# Patient Record
Sex: Female | Born: 1975 | Race: White | Hispanic: No | Marital: Married | Smoking: Former smoker
Health system: Southern US, Community
[De-identification: ages and names within clinical notes are randomized; demographics above are authoritative.]

## PROBLEM LIST (undated history)

## (undated) ENCOUNTER — Emergency Department (HOSPITAL_BASED_OUTPATIENT_CLINIC_OR_DEPARTMENT_OTHER): Admission: EM | Payer: 59

## (undated) DIAGNOSIS — D259 Leiomyoma of uterus, unspecified: Secondary | ICD-10-CM

## (undated) DIAGNOSIS — K449 Diaphragmatic hernia without obstruction or gangrene: Secondary | ICD-10-CM

## (undated) DIAGNOSIS — D649 Anemia, unspecified: Secondary | ICD-10-CM

## (undated) HISTORY — DX: Diaphragmatic hernia without obstruction or gangrene: K44.9

## (undated) HISTORY — DX: Anemia, unspecified: D64.9

## (undated) HISTORY — PX: OTHER SURGICAL HISTORY: SHX169

## (undated) HISTORY — DX: Leiomyoma of uterus, unspecified: D25.9

---

## 2002-01-21 ENCOUNTER — Other Ambulatory Visit: Admission: RE | Admit: 2002-01-21 | Discharge: 2002-01-21 | Payer: Self-pay | Admitting: Gynecology

## 2002-08-27 ENCOUNTER — Inpatient Hospital Stay (HOSPITAL_COMMUNITY): Admission: AD | Admit: 2002-08-27 | Discharge: 2002-08-30 | Payer: Self-pay | Admitting: Gynecology

## 2002-09-04 ENCOUNTER — Encounter: Admission: RE | Admit: 2002-09-04 | Discharge: 2002-10-04 | Payer: Self-pay | Admitting: Gynecology

## 2002-10-05 ENCOUNTER — Encounter: Admission: RE | Admit: 2002-10-05 | Discharge: 2002-11-04 | Payer: Self-pay | Admitting: Gynecology

## 2002-10-10 ENCOUNTER — Other Ambulatory Visit: Admission: RE | Admit: 2002-10-10 | Discharge: 2002-10-10 | Payer: Self-pay | Admitting: Gynecology

## 2004-04-26 ENCOUNTER — Other Ambulatory Visit: Admission: RE | Admit: 2004-04-26 | Discharge: 2004-04-26 | Payer: Self-pay | Admitting: Gynecology

## 2004-11-24 ENCOUNTER — Inpatient Hospital Stay (HOSPITAL_COMMUNITY): Admission: AD | Admit: 2004-11-24 | Discharge: 2004-11-26 | Payer: Self-pay | Admitting: Gynecology

## 2004-11-27 ENCOUNTER — Encounter: Admission: RE | Admit: 2004-11-27 | Discharge: 2004-12-26 | Payer: Self-pay | Admitting: Gynecology

## 2004-12-27 ENCOUNTER — Encounter: Admission: RE | Admit: 2004-12-27 | Discharge: 2005-01-26 | Payer: Self-pay | Admitting: Gynecology

## 2005-01-11 ENCOUNTER — Other Ambulatory Visit: Admission: RE | Admit: 2005-01-11 | Discharge: 2005-01-11 | Payer: Self-pay | Admitting: Gynecology

## 2005-01-27 ENCOUNTER — Encounter: Admission: RE | Admit: 2005-01-27 | Discharge: 2005-02-26 | Payer: Self-pay | Admitting: Gynecology

## 2005-02-27 ENCOUNTER — Encounter: Admission: RE | Admit: 2005-02-27 | Discharge: 2005-03-26 | Payer: Self-pay | Admitting: Gynecology

## 2005-03-27 ENCOUNTER — Encounter: Admission: RE | Admit: 2005-03-27 | Discharge: 2005-04-05 | Payer: Self-pay | Admitting: Gynecology

## 2007-03-12 ENCOUNTER — Encounter: Admission: RE | Admit: 2007-03-12 | Discharge: 2007-03-12 | Payer: Self-pay | Admitting: Surgery

## 2007-08-08 ENCOUNTER — Other Ambulatory Visit: Admission: RE | Admit: 2007-08-08 | Discharge: 2007-08-08 | Payer: Self-pay | Admitting: Family Medicine

## 2008-04-13 ENCOUNTER — Emergency Department (HOSPITAL_COMMUNITY): Admission: EM | Admit: 2008-04-13 | Discharge: 2008-04-13 | Payer: Self-pay | Admitting: Emergency Medicine

## 2008-08-10 ENCOUNTER — Other Ambulatory Visit: Admission: RE | Admit: 2008-08-10 | Discharge: 2008-08-10 | Payer: Self-pay | Admitting: Family Medicine

## 2009-08-11 ENCOUNTER — Other Ambulatory Visit: Admission: RE | Admit: 2009-08-11 | Discharge: 2009-08-11 | Payer: Self-pay | Admitting: *Deleted

## 2010-01-30 ENCOUNTER — Encounter: Payer: Self-pay | Admitting: Surgery

## 2010-05-27 NOTE — Discharge Summary (Signed)
   NAME:  Janice Lawrence, Janice Lawrence                             ACCOUNT NO.:  [id number]   MEDICAL RECORD NO.:  [id number]                   PATIENT TYPE:   LOCATION:                                       FACILITY:   PHYSICIAN:  Timothy P. Fontaine, M.D.           DATE OF BIRTH:   DATE OF ADMISSION:  08/27/2002  DATE OF DISCHARGE:  08/30/2002                                 DISCHARGE SUMMARY   DISCHARGE DIAGNOSES:  Intrauterine pregnancy at term with pregnancy-induced  hypertension.   PROCEDURE:  Induced labor with vaginal delivery with midline episiotomy with  a perineal laceration of a viable female.   HISTORY OF PRESENT ILLNESS:  A 35 year old gravida 1, para 0 with an EDC of  September 07, 2002. Currently at 38-5/7 weeks. She was admitted to [facility] secondary to history of labile PIH. She had been followed with  antepartum testing consisting of NST, AFIs, and blood pressure monitoring.  She has had PIH panel, which was normal. Blood pressure at the office was  136/86 with reflexes of +1 to 2 with trace pitting edema. No clonus. She had  no upper quadrant pain, visual changes or headaches. Potentially, she had  underlying chronic hypertension.   LABORATORY DATA:  Blood type was A+, antibody negative. Serology  nonreactive. Rubella titer positive. Hepatitis non-reactive. HIV non-  reactive and group B strep negative.   HOSPITAL COURSE:  She was admitted on August 27, 2002, for an induction due  to the [facility]. She was given Cervidil, Pitocin and AROM. She had a spontaneous  delivery with midline episiotomy for perineal laceration of a viable female  with Apgars of 9 and 9 with a birth weight of 7 pounds, 5 ounces.  Postpartum, she remained afebrile, voiding, and was discharged in  satisfactory condition on her second postpartum day. Blood pressures  remained in the 120s over 80s postpartum.   DISCHARGE LABORATORY DATA:  White count 8, hemoglobin 9.4, hematocrit 26.5  and platelets  91,000.   DISPOSITION:  Discharged to home. Instructed to followup in 6 weeks or as  needed. Continue prenatal vitamins and iron. Prescription for Motrin as  needed for pain. __________ discharge booklet.       Davonna Belling. Young, N.P.                      Timothy P. Fontaine, M.D.    [facility]  D:  09/26/2002  T:  09/27/2002  Job:  [id number]

## 2010-05-27 NOTE — H&P (Signed)
NAME:  Bettcher, Lataja                ACCOUNT NO.:  [id number]   MEDICAL RECORD NO.:  [id number]           PATIENT TYPE:   LOCATION:                                [facility]ACILITY:  [facility]   PHYSICIAN:  Juan H. Lily Peer, M.D.DATE OF BIRTH:  [birth year]   DATE OF ADMISSION:  11/24/2004  DATE OF DISCHARGE:  11/26/2004                                HISTORY & PHYSICAL   CHIEF COMPLAINT:  1.  Postdate pregnancy.  2.  Favorable cervix.   HISTORY:  The patient is a 35 year old, gravida 2, para 1, last menstrual  period February 11, 2004, estimated date of confinement November 17, 2004.  Currently 40-1/[redacted] weeks gestation, will be admitted to [facility] on  Thursday, November 16, at 5:30 a.m. for initiation of Pitocin, followed by  rupture of membranes for delivery.  The patient was seen in the office on  November 14 for a routine prenatal visit.  Her blood pressure initially was  found to be elevated at 140/90; on repeat was 118/80, and her cervix is  found to be 2-3 cm, 80% effaced, -3 station.  The patient's prenatal course  is found that she has had labile hypertension which has resolved with  positioning and rest.  Otherwise, the remainder of her prenatal course has  been essentially unremarkable.   PAST MEDICAL HISTORY:  1.  The patient with 1 normal spontaneous vaginal delivery in 2004.  2.  She is currently on prenatal vitamins and iron.   REVIEW OF SYSTEMS:  See [place] form.   PHYSICAL EXAMINATION:  HEENT:  Unremarkable.  NECK:  Supple.  Trachea midline.  No carotid bruits.  No thyromegaly.  LUNGS:  Clear to auscultation without rhonchi or wheezes.  HEART:  Regular rate and rhythm without any murmurs or gallops.  BREAST EXAM:  Not done.  ABDOMEN:  Gravid uterus, vertex presentation by [employer].  Fundal  height 39 cm.  PELVIC EXAM:  Cervix was 2-3 cm dilated, 80% effaced, -3 station.  EXTREMITIES:  DTR 1+, negative clonus, trace edema.   ASSESSMENT:  A 35 year old, gravida  1, para 1, at 40-1/2 weeks' estimated  gestational age with advanced cervical dilatation, will be admitted to  [facility] on Thursday, November 18 for a Pitocin induction  followed by rupture of membranes and with goals of successfully delivering  vaginally.  The patient's GBS culture was negative.  Prenatal labs:  See  [place] form.   PLAN:  As per assessment above.      Juan H. Lily Peer, M.D.  Electronically Signed     JHF/MEDQ  D:  11/22/2004  T:  11/22/2004  Job:  [id number]

## 2010-05-27 NOTE — H&P (Signed)
NAME:  Janice Lawrence, Janice Lawrence                            ACCOUNT NO.:  [id number]   MEDICAL RECORD NO.:  [id number]                   PATIENT TYPE:  MAT   LOCATION:                                       FACILITY:  [facility]   PHYSICIAN:  Juan H. Lily Peer, M.D.             DATE OF BIRTH:   DATE OF ADMISSION:  08/27/2002  DATE OF DISCHARGE:                                HISTORY & PHYSICAL   CHIEF COMPLAINT:  Labile pregnancy-induced hypertension, at term.   HISTORY:  The patient is a 35 year old gravida 1, para 0 with an estimated  date of confinement of September 07, 2002; currently at 38-1/2 weeks estimated  gestational age.  She is being admitted to [facility] secondary to the  fact that the patient has had history of labile PIH.  She has been followed  with antepartum testing, consisting of NST's, AFI's and also blood pressure  monitoring as well.  She has also had PIH panel, which has been normal; the  last one done recently.  The last time she was seen in the office was August 25, 2002; her blood pressure was 136/86, DTR was 1-2+ with trace pitting  edema, no clonus.  She had denied any right upper quadrant pain, any blurry  vision or headaches.  She could potentially have underlying chronic  hypertension.  Review of her prenatal records have indicated that her  baseline blood pressure has been 126/74, but at times it had gotten up as  high as 140/90, 130/82 (which would return back to normal on rest).  She  also had been evaluated by [facility] perinatal service, secondary to the fact  that at time of screening ultrasound there was a questionable bright  endocardium in the right ventricle.  The perinatologist did a Level 3 scan;  did not demonstrate any abnormality, normal four-chamber view, and on follow-  up scan at 38 weeks the four-chamber heart appeared to be normal with no  abnormality.  There did come a time that during the scan that there appears  to be some right renal pyelectasis,  which will need to be followed up with  an ultrasound after the newborn is delivered.  The rest of the pregnancy has  otherwise been uneventful.   PAST MEDICAL HISTORY:  The patient has had LASIK surgery of her eyes.  She  has had a wisdom tooth removed.   ALLERGIES:  The patient denies any allergies.   REVIEW OF SYSTEMS:  See [place] form.   PHYSICAL EXAMINATION:  VITAL SIGNS:  Blood pressure 136/86, weight 198-1/2  pounds.  Urine was trace protein, negative glucose.  HEENT:  Unremarkable.  NECK:  Supple.  Trachea midline.  No carotid bruits, no thyromegaly.  LUNGS:  Clear to auscultation without rhonchi or wheezes.  HEART:  Regular rate and rhythm; no murmurs or gallops.  GU:  Vaginal examination not done.  ABDOMEN:  Gravid uterus, vertex presentation by [employer].  Positive  fetal heart tones.  PELVIC:  Cervix 1 cm, 50% effaced, -3 station.  EXTREMITIES:  DTR 1-2+.  Negative clonus.  Trace edema.   PRENATAL LABS:  Blood type A positive.  Negative antibody screen.  VDRL was  nonreactive.  Rubella immune.  Hepatitis B surface antigen and HIV were  negative.  Internal serum alpha fetoprotein was normal.  Diabetes screen  normal.  GBS culture negative.   ASSESSMENT:  A 35 year old gravida 1, para 0 at 38-1/2 weeks estimated  gestational age.  Scheduled to be admitted this evening for cervical  ripening agent with Cervidil, with initiation of Pitocin in the morning.  Will obtain CBC and PIH panel upon admission.  Will continue to monitor her  blood pressures closely, in the event that she may need to be placed on  magnesium sulfate.  The patient also fully aware that due to her cervix not  being as favorable, that this could potentially be a serial induction.  All  questions are answered and will follow accordingly.   PLAN:  As per assessment above.                                                Juan H. Lily Peer, M.D.    JHF/MEDQ  D:  08/27/2002  T:  08/27/2002   Job:  [id number]

## 2010-07-21 ENCOUNTER — Other Ambulatory Visit: Payer: Self-pay | Admitting: Family Medicine

## 2010-07-21 DIAGNOSIS — R1032 Left lower quadrant pain: Secondary | ICD-10-CM

## 2010-07-22 ENCOUNTER — Other Ambulatory Visit: Payer: Self-pay | Admitting: Pediatrics

## 2010-07-25 ENCOUNTER — Ambulatory Visit
Admission: RE | Admit: 2010-07-25 | Discharge: 2010-07-25 | Disposition: A | Discharge status: Home / Self Care | Payer: BC Managed Care – PPO | Source: Ambulatory Visit | Attending: Family Medicine | Admitting: Family Medicine

## 2010-07-25 DIAGNOSIS — R1032 Left lower quadrant pain: Secondary | ICD-10-CM

## 2011-04-25 ENCOUNTER — Other Ambulatory Visit (HOSPITAL_COMMUNITY)
Admission: RE | Admit: 2011-04-25 | Discharge: 2011-04-25 | Disposition: A | Discharge status: Home / Self Care | Payer: 59 | Source: Ambulatory Visit | Attending: Family Medicine | Admitting: Family Medicine

## 2011-04-25 ENCOUNTER — Other Ambulatory Visit: Payer: Self-pay | Admitting: Family Medicine

## 2011-04-25 ENCOUNTER — Other Ambulatory Visit: Payer: Self-pay

## 2011-04-25 DIAGNOSIS — Z803 Family history of malignant neoplasm of breast: Secondary | ICD-10-CM

## 2011-04-25 DIAGNOSIS — N63 Unspecified lump in unspecified breast: Secondary | ICD-10-CM

## 2011-04-25 DIAGNOSIS — Z01419 Encounter for gynecological examination (general) (routine) without abnormal findings: Secondary | ICD-10-CM | POA: Insufficient documentation

## 2011-04-25 DIAGNOSIS — Q8909 Congenital malformations of spleen: Secondary | ICD-10-CM

## 2011-04-28 ENCOUNTER — Ambulatory Visit
Admission: RE | Admit: 2011-04-28 | Discharge: 2011-04-28 | Disposition: A | Discharge status: Home / Self Care | Payer: 59 | Source: Ambulatory Visit | Attending: Family Medicine | Admitting: Family Medicine

## 2011-04-28 DIAGNOSIS — Z803 Family history of malignant neoplasm of breast: Secondary | ICD-10-CM

## 2011-04-28 DIAGNOSIS — N63 Unspecified lump in unspecified breast: Secondary | ICD-10-CM

## 2013-07-15 ENCOUNTER — Other Ambulatory Visit: Payer: Self-pay | Admitting: Family Medicine

## 2013-07-15 ENCOUNTER — Other Ambulatory Visit (HOSPITAL_COMMUNITY)
Admission: RE | Admit: 2013-07-15 | Discharge: 2013-07-15 | Disposition: A | Discharge status: Home / Self Care | Payer: 59 | Source: Ambulatory Visit | Attending: Family Medicine | Admitting: Family Medicine

## 2013-07-15 DIAGNOSIS — Z0000 Encounter for general adult medical examination without abnormal findings: Secondary | ICD-10-CM | POA: Insufficient documentation

## 2013-07-15 DIAGNOSIS — N63 Unspecified lump in unspecified breast: Secondary | ICD-10-CM

## 2013-07-17 LAB — CYTOLOGY - PAP

## 2013-07-23 ENCOUNTER — Ambulatory Visit
Admission: RE | Admit: 2013-07-23 | Discharge: 2013-07-23 | Disposition: A | Discharge status: Home / Self Care | Payer: 59 | Source: Ambulatory Visit | Attending: Family Medicine | Admitting: Family Medicine

## 2013-07-23 DIAGNOSIS — N63 Unspecified lump in unspecified breast: Secondary | ICD-10-CM

## 2015-10-19 ENCOUNTER — Ambulatory Visit: Payer: Self-pay | Admitting: General Surgery

## 2016-03-17 DIAGNOSIS — Z79899 Other long term (current) drug therapy: Secondary | ICD-10-CM | POA: Diagnosis not present

## 2017-06-15 DIAGNOSIS — J029 Acute pharyngitis, unspecified: Secondary | ICD-10-CM | POA: Diagnosis not present

## 2017-09-19 DIAGNOSIS — Z7289 Other problems related to lifestyle: Secondary | ICD-10-CM | POA: Diagnosis not present

## 2018-01-08 DIAGNOSIS — Z720 Tobacco use: Secondary | ICD-10-CM | POA: Diagnosis not present

## 2019-04-03 ENCOUNTER — Ambulatory Visit: Payer: 59 | Attending: Internal Medicine

## 2019-04-03 DIAGNOSIS — Z23 Encounter for immunization: Secondary | ICD-10-CM

## 2019-04-03 NOTE — Progress Notes (Signed)
   Covid-19 Vaccination Clinic  Name:  Janice Lawrence    MRN: [id number] DOB: [birth year]  04/03/2019  [name] was observed post Covid-19 immunization for 15 minutes without incident. She was provided with Vaccine Information Sheet and instruction to access the V-Safe system.   [name] was instructed to call 911 with any severe reactions post vaccine: Marland Kitchen Difficulty breathing  . Swelling of face and throat  . A fast heartbeat  . A bad rash all over body  . Dizziness and weakness   Immunizations Administered    Name Date Dose VIS Date Route   Pfizer COVID-19 Vaccine 04/03/2019  4:27 PM 0.3 mL 12/20/2018 Intramuscular   Manufacturer: [place]   Lot: [id number]   [place]: [id number]

## 2019-04-28 ENCOUNTER — Ambulatory Visit: Payer: 59 | Attending: Internal Medicine

## 2019-04-28 DIAGNOSIS — Z23 Encounter for immunization: Secondary | ICD-10-CM

## 2019-04-28 NOTE — Progress Notes (Signed)
   Covid-19 Vaccination Clinic  Name:  Janice Lawrence    MRN: [id number] DOB: [birth year]  04/28/2019  Janice Lawrence was observed post Covid-19 immunization for 15 minutes without incident. She was provided with Vaccine Information Sheet and instruction to access the V-Safe system.   Janice Lawrence was instructed to call 911 with any severe reactions post vaccine: Marland Kitchen Difficulty breathing  . Swelling of face and throat  . A fast heartbeat  . A bad rash all over body  . Dizziness and weakness   Immunizations Administered    Name Date Dose VIS Date Route   Pfizer COVID-19 Vaccine 04/28/2019  1:56 PM 0.3 mL 03/05/2018 Intramuscular   Manufacturer: [place]   Lot: [id number]   [place]: [id number]

## 2021-05-25 ENCOUNTER — Other Ambulatory Visit: Payer: Self-pay | Admitting: Obstetrics and Gynecology

## 2021-05-25 DIAGNOSIS — R928 Other abnormal and inconclusive findings on diagnostic imaging of breast: Secondary | ICD-10-CM

## 2021-05-26 ENCOUNTER — Encounter (HOSPITAL_COMMUNITY): Payer: Self-pay | Admitting: Emergency Medicine

## 2021-05-26 ENCOUNTER — Emergency Department (HOSPITAL_COMMUNITY)
Admission: EM | Admit: 2021-05-26 | Discharge: 2021-05-26 | Disposition: A | Discharge status: Home / Self Care | Payer: 59 | Attending: Emergency Medicine | Admitting: Emergency Medicine

## 2021-05-26 ENCOUNTER — Encounter: Payer: Self-pay | Admitting: Physician Assistant

## 2021-05-26 ENCOUNTER — Other Ambulatory Visit: Payer: Self-pay

## 2021-05-26 ENCOUNTER — Emergency Department (HOSPITAL_COMMUNITY): Payer: 59

## 2021-05-26 DIAGNOSIS — D500 Iron deficiency anemia secondary to blood loss (chronic): Secondary | ICD-10-CM | POA: Insufficient documentation

## 2021-05-26 DIAGNOSIS — D259 Leiomyoma of uterus, unspecified: Secondary | ICD-10-CM | POA: Diagnosis not present

## 2021-05-26 DIAGNOSIS — D649 Anemia, unspecified: Secondary | ICD-10-CM | POA: Diagnosis present

## 2021-05-26 LAB — CBC
HCT: 27.7 % — ABNORMAL LOW (ref 36.0–46.0)
Hemoglobin: 7.2 g/dL — ABNORMAL LOW (ref 12.0–15.0)
MCH: 16 pg — ABNORMAL LOW (ref 26.0–34.0)
MCHC: 26 g/dL — ABNORMAL LOW (ref 30.0–36.0)
MCV: 61.4 fL — ABNORMAL LOW (ref 80.0–100.0)
Platelets: 247 10*3/uL (ref 150–400)
RBC: 4.51 MIL/uL (ref 3.87–5.11)
RDW: 20.1 % — ABNORMAL HIGH (ref 11.5–15.5)
WBC: 7.2 10*3/uL (ref 4.0–10.5)
nRBC: 0 % (ref 0.0–0.2)

## 2021-05-26 LAB — COMPREHENSIVE METABOLIC PANEL
ALT: 13 U/L (ref 0–44)
AST: 14 U/L — ABNORMAL LOW (ref 15–41)
Albumin: 4.2 g/dL (ref 3.5–5.0)
Alkaline Phosphatase: 35 U/L — ABNORMAL LOW (ref 38–126)
Anion gap: 8 (ref 5–15)
BUN: 12 mg/dL (ref 6–20)
CO2: 22 mmol/L (ref 22–32)
Calcium: 9.5 mg/dL (ref 8.9–10.3)
Chloride: 106 mmol/L (ref 98–111)
Creatinine, Ser: 0.85 mg/dL (ref 0.44–1.00)
GFR, Estimated: >60 mL/min (ref >60)
Glucose, Bld: 112 mg/dL — ABNORMAL HIGH (ref 70–99)
Potassium: 4.2 mmol/L (ref 3.5–5.1)
Sodium: 136 mmol/L (ref 135–145)
Total Bilirubin: 0.4 mg/dL (ref 0.3–1.2)
Total Protein: 7.4 g/dL (ref 6.5–8.1)

## 2021-05-26 LAB — TYPE AND SCREEN
ABO/RH(D): A POS
Antibody Screen: NEGATIVE

## 2021-05-26 LAB — POC URINE PREG, ED: Preg Test, Ur: NEGATIVE

## 2021-05-26 LAB — POC OCCULT BLOOD, ED: Fecal Occult Bld: NEGATIVE

## 2021-05-26 MED ORDER — IOHEXOL 300 MG/ML  SOLN
100.0000 mL | Freq: Once | INTRAMUSCULAR | Status: AC | PRN
  Administered 2021-05-26: 100 mL via INTRAVENOUS

## 2021-05-26 MED ORDER — SODIUM CHLORIDE 0.9 % IV BOLUS: 1000.0000 mL | Freq: Once | INTRAVENOUS | Status: DC

## 2021-05-26 MED ORDER — FERROUS SULFATE 325 (65 FE) MG PO TABS: 325.0000 mg | ORAL_TABLET | Freq: Every day | ORAL | 0 refills | Status: AC

## 2021-05-26 NOTE — ED Triage Notes (Addendum)
Patient referred to ED for anemia, reported Hgb of 7. Patient denies known sources of bleeding, reports fatigue over the last few months. Patient is alert, oriented, ambulatory, and in no apparent distress at this time.

## 2021-05-26 NOTE — Discharge Instructions (Signed)
Follow-up with your GYN doctor as planned

## 2021-05-26 NOTE — ED Provider Notes (Signed)
[facility] EMERGENCY DEPARTMENT Provider Note   CSN: [id number] Arrival date & time: 05/26/21  1620     History  Chief Complaint  Patient presents with   Anemia    Janice Lawrence is a 46 y.o. female.  Patient complains of fatigue.  She saw her family doctor and her blood count was very low.  Patient complains of weakness.  She has no other medical problems  The history is provided by the patient and a friend. No language interpreter was used.  Anemia This is a new problem. The current episode started 2 days ago. The problem occurs constantly. The problem has not changed since onset.Pertinent negatives include no chest pain, no abdominal pain and no headaches. Nothing aggravates the symptoms. Nothing relieves the symptoms. She has tried nothing for the symptoms. The treatment provided no relief.      Home Medications Prior to Admission medications   Medication Sig Start Date End Date Taking? Authorizing Provider  ferrous sulfate 325 (65 FE) MG tablet Take 1 tablet (325 mg total) by mouth daily. 05/26/21  Yes Milton Ferguson, MD      Allergies    Patient has no known allergies.    Review of Systems   Review of Systems  Constitutional:  Positive for fatigue. Negative for appetite change.  HENT:  Negative for congestion, ear discharge and sinus pressure.   Eyes:  Negative for discharge.  Respiratory:  Negative for cough.   Cardiovascular:  Negative for chest pain.  Gastrointestinal:  Negative for abdominal pain and diarrhea.  Genitourinary:  Negative for frequency and hematuria.  Musculoskeletal:  Negative for back pain.  Skin:  Negative for rash.  Neurological:  Negative for seizures and headaches.  Psychiatric/Behavioral:  Negative for hallucinations.    Physical Exam Updated Vital Signs BP 120/82   Pulse 80   Temp 98.4 F (36.9 C) (Oral)   Resp 19   SpO2 100%  Physical Exam Vitals and nursing note reviewed.  Constitutional:      Appearance: She  is well-developed.  HENT:     Head: Normocephalic.     Nose: Nose normal.  Eyes:     General: No scleral icterus.    Conjunctiva/sclera: Conjunctivae normal.  Neck:     Thyroid: No thyromegaly.  Cardiovascular:     Rate and Rhythm: Normal rate and regular rhythm.     Heart sounds: No murmur heard.   No friction rub. No gallop.  Pulmonary:     Breath sounds: No stridor. No wheezing or rales.  Chest:     Chest wall: No tenderness.  Abdominal:     General: There is no distension.     Tenderness: There is no abdominal tenderness. There is no rebound.  Musculoskeletal:        General: Normal range of motion.     Cervical back: Neck supple.  Lymphadenopathy:     Cervical: No cervical adenopathy.  Skin:    Findings: No erythema or rash.  Neurological:     Mental Status: She is alert and oriented to person, place, and time.     Motor: No abnormal muscle tone.     Coordination: Coordination normal.  Psychiatric:        Behavior: Behavior normal.    ED Results / Procedures / Treatments   Labs (all labs ordered are listed, but only abnormal results are displayed) Labs Reviewed  COMPREHENSIVE METABOLIC PANEL - Abnormal; Notable for the following components:  Result Value   Glucose, Bld 112 (*)    AST 14 (*)    Alkaline Phosphatase 35 (*)    All other components within normal limits  CBC - Abnormal; Notable for the following components:   Hemoglobin 7.2 (*)    HCT 27.7 (*)    MCV 61.4 (*)    MCH 16.0 (*)    MCHC 26.0 (*)    RDW 20.1 (*)    All other components within normal limits  I-STAT BETA HCG BLOOD, ED ([facility], [facility], [facility] ONLY)  POC OCCULT BLOOD, ED  POC URINE PREG, ED  TYPE AND SCREEN  ABO/RH    EKG None  Radiology CT ABDOMEN PELVIS W CONTRAST  Result Date: 05/26/2021 CLINICAL DATA:  Fatigue and shortness of breath.  Anemia. EXAM: CT ABDOMEN AND PELVIS WITH CONTRAST TECHNIQUE: Multidetector CT imaging of the abdomen and pelvis was performed using the standard  protocol following bolus administration of intravenous contrast. RADIATION DOSE REDUCTION: This exam was performed according to the departmental dose-optimization program which includes automated exposure control, adjustment of the mA and/or kV according to patient size and/or use of iterative reconstruction technique. CONTRAST:  122m OMNIPAQUE IOHEXOL 300 MG/ML  SOLN COMPARISON:  None Available. FINDINGS: Lower chest: Small type 1 hiatal hernia. Hepatobiliary: 4 mm hypodense lesion in the right hepatic lobe on image 25 series 3, technically too small to characterize although statistically highly likely to be benign cyst. Mildly contracted gallbladder. No biliary dilatation. Pancreas: Unremarkable Spleen: Unremarkable Adrenals/Urinary Tract: Unremarkable Stomach/Bowel: Unremarkable Vascular/Lymphatic: Minimal abdominal aortic atherosclerotic calcification. Reproductive: Approximately 8.0 by 4.8 by 7.5 cm (volume = 150 cm^3) mass in the anterior uterine body has an appearance favoring uterine fibroid, and causes some mild flattening/effacement of the endometrium. No similar lesion was observed on the ultrasound from 07/25/2010. Rim enhancing probable corpus luteum in the right ovary on image 70 series 3. Trace free fluid, likely physiologic. Other: No supplemental non-categorized findings. Musculoskeletal: Unremarkable IMPRESSION: 1. 8.0 cm in long axis mass in the anterior uterine body most compatible with fibroid. 2. Small type 1 hiatal hernia. 3. Minimal abdominal aortic atherosclerotic calcification. Electronically Signed   By: WVan ClinesM.D.   On: 05/26/2021 20:47    Procedures Procedures    Medications Ordered in ED Medications  sodium chloride 0.9 % bolus 1,000 mL (1,000 mLs Intravenous Patient Refused/Not Given 05/26/21 1828)  iohexol (OMNIPAQUE) 300 MG/ML solution 100 mL (100 mLs Intravenous Contrast Given 05/26/21 2038)    ED Course/ Medical Decision Making/ A&P                            Medical Decision Making Amount and/or Complexity of Data Reviewed Labs: ordered. Radiology: ordered. ECG/medicine tests: ordered.  Risk OTC drugs. Prescription drug management.  This patient presents to the ED for concern of anemia and weakness, this involves an extensive number of treatment options, and is a complaint that carries with it a high risk of complications and morbidity.  The differential diagnosis includes GI bleed, profuse vaginal bleeding   Co morbidities that complicate the patient evaluation  None   Additional history obtained:  Additional history obtained from relative External records from outside source obtained and reviewed including hospital records   Lab Tests:  I Ordered, and personally interpreted labs.  The pertinent results include: CBC shows hemoglobin seven-point   Imaging Studies ordered:  I ordered imaging studies including CT abdomen I independently visualized and interpreted imaging  which showed uterine fibroid I agree with the radiologist interpretation   Cardiac Monitoring: / EKG:  The patient was maintained on a cardiac monitor.  I personally viewed and interpreted the cardiac monitored which showed an underlying rhythm of: Normal sinus rhythm   Consultations Obtained:  No consult  Problem List / ED Course / Critical interventions / Medication management  Anemia I ordered medication including patient is prescribed iron for anemia Reevaluation of the patient after these medicines showed that the patient stayed the same I have reviewed the patients home medicines and have made adjustments as needed   Social Determinants of Health:  None   Test / Admission - Considered:  Ultrasound of pelvis  Patient with anemia and uterine fibroid.  She is placed on iron and will follow-up with her GYN doctor        Final Clinical Impression(s) / ED Diagnoses Final diagnoses:  Uterine leiomyoma, unspecified location  Iron  deficiency anemia due to chronic blood loss    Rx / DC Orders ED Discharge Orders          Ordered    ferrous sulfate 325 (65 FE) MG tablet  Daily        05/26/21 2212              Milton Ferguson, MD 05/30/21 [phone]

## 2021-05-26 NOTE — ED Notes (Signed)
Pt going to CT scan

## 2021-05-26 NOTE — ED Provider Triage Note (Signed)
Emergency Medicine Provider Triage Evaluation Note  Janice Lawrence , a 46 y.o. female  was evaluated in triage.  Pt complains of fatigue, shortness of breath.  She went to PCP for an annual physical and was found to be anemic with a hemoglobin of 7.  She was sent to the ER.  She is unable to find any obvious source of bleeding.  She states that her menstrual cycle is slightly heavier than usual the first 2 days but this has been present since she had children.  No bloody stools that she is aware of.  She did have diarrhea last week but denies any obvious blood.  Review of Systems  Positive: Fatigue, shortness of breath Negative: Bleeding  Physical Exam  BP (!) 138/92 (BP Location: Right Arm)   Pulse (!) 111   Temp 98.4 F (36.9 C) (Oral)   Resp 12   SpO2 100%  Gen:   Awake, no distress   Resp:  Normal effort  MSK:   Moves extremities without difficulty Other:  Tachycardic  Medical Decision Making  Medically screening exam initiated at 4:35 PM.  Appropriate orders placed.  Janice Lawrence was informed that the remainder of the evaluation will be completed by another provider, this initial triage assessment does not replace that evaluation, and the importance of remaining in the ED until their evaluation is complete.  Labs and type and screen ordered   Delia Heady, PA-C 05/26/21 1640

## 2021-05-27 LAB — I-STAT BETA HCG BLOOD, ED (MC, WL, AP ONLY): I-stat hCG, quantitative: <5 m[IU]/mL (ref <5)

## 2021-05-30 ENCOUNTER — Ambulatory Visit
Admission: RE | Admit: 2021-05-30 | Discharge: 2021-05-30 | Disposition: A | Discharge status: Home / Self Care | Payer: 59 | Source: Ambulatory Visit | Attending: Obstetrics and Gynecology | Admitting: Obstetrics and Gynecology

## 2021-05-30 DIAGNOSIS — R928 Other abnormal and inconclusive findings on diagnostic imaging of breast: Secondary | ICD-10-CM

## 2021-05-31 ENCOUNTER — Telehealth: Payer: Self-pay | Admitting: Nurse Practitioner

## 2021-05-31 ENCOUNTER — Other Ambulatory Visit: Payer: Self-pay | Admitting: Obstetrics and Gynecology

## 2021-05-31 DIAGNOSIS — R928 Other abnormal and inconclusive findings on diagnostic imaging of breast: Secondary | ICD-10-CM

## 2021-05-31 NOTE — Telephone Encounter (Signed)
Scheduled appt per 5/23 referral. Pt is aware of appt date and time. Pt is aware to arrive 15 mins prior to appt time and to bring and updated insurance card. Pt is aware of appt location.   

## 2021-06-01 NOTE — Progress Notes (Incomplete)
[name]   [phone] Fax:([phone] [name] consult Note   Patient Care Team: Carlyon Shadow, MD as PCP - General (Obstetrics and Gynecology) 06/02/2021  CHIEF COMPLAINTS/PURPOSE OF CONSULTATION:  Anemia, referred by PCP Ilsa Iha, NP  HISTORY OF PRESENTING ILLNESS:  Janice Lawrence 46 y.o. female with no significant PMH is here because of anemia. She had a normal CBC on 03/17/2016 and 09/19/2017. 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 She went for routine wellness visit with PCP 05/25/2021, labs that day showed hemoglobin 7.2, HCT 26.2%, MCV 59.4, elevated RDW 20.8, with normal WBC and platelet count.  Normal kidney and liver function function.  She was sent to ED, labs drawn revealing Hgb 7.2 but they would not transfuse due to hemoglobin above 7.  FOBT was negative.  CT AP 05/26/2021 showed a 4 mm hypodense right liver lesion felt to be a benign cyst and an 8.0 x 4.8 x 7.5 cm mass in the anterior uterine body favoring a fibroid. She was started on oral iron 1 week ago, has been taking 1 tab ferrous sulfate and 2 tablets "blood builder" supplement she found online daily; PCP referred to [place].   Socially, she is married with 2 healthy children born in 2004 and 2006.  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  She smoked half pack per day cigarettes x50 years, quit 2 years ago then started vaping but quit that a month ago.  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  She is in the process of updating her age-appropriate cancer screenings, recent Pap was clear.  She has a consult June 8 for colonoscopy, and had a recent right mammogram that showed possible calcifications, a biopsy of the right breast is scheduled on 06/09/2021.  She has strong family history of cancer including 2 maternal aunts with breast  cancer, 1 maternal aunt with pancreatic and lung cancer, a paternal aunt with breast cancer, a paternal uncle with lung cancer, and a maternal grandmother with liver and breast cancer.  Today, she presents by herself.  She has suffered from chronic fatigue she attributed to alcohol, possible depression, and her stressful job.  When she quit alcohol this did not improve.  She has intermittent night sweats without fever, she attributed this to alcohol as well and possible perimenopause.  She has palpitations, exertional dyspnea, and lightheadedness on standing for the past few months.  She has had a sore on her right tongue for the past 6 months that is not healing, she has an ENT visit coming up for this.  She notes that her periods have gotten heavier since having her second child in 2006.  She has painful and heavy periods monthly, with heavy bleeding and clots for the first 2 days where she wears a pad continuously and changes a tampon every hour.  She has discussed a partial hysterectomy with OB/GYN which she will likely get done this summer.  She reports pickup with ice for many years.  She denies any other bleeding.  Infrequently takes NSAIDs for the sore on her tongue.  She does feel better since she started oral iron.   MEDICAL HISTORY:  History reviewed. No pertinent past medical history.  SURGICAL HISTORY: History reviewed. No pertinent surgical history.  SOCIAL HISTORY: Social History   Socioeconomic  History   Marital status: Married    Spouse name: Not on file   Number of children: 2   Years of education: Not on file   Highest education level: Not on file  Occupational History   Not on file  Tobacco Use   Smoking status: Former    Packs/day: 0.50    Years: 15.00    Pack years: 7.50    Types: Cigarettes    Quit date: 01/10/2020    Years since quitting: 1.3   Smokeless tobacco: Not on file  Vaping Use   Vaping Use: Former   Quit date: 04/09/2021  Substance and Sexual Activity    Alcohol use: Not Currently    Comment: 2 bottles daily x10 years, quit 01/10/20   Drug use: Not on file    Comment: [place], stopped 05/09/21   Sexual activity: Yes  Other Topics Concern   Not on file  Social History Narrative   Not on file   Social Determinants of Health   Financial Resource Strain: Not on file  Food Insecurity: Not on file  Transportation Needs: Not on file  Physical Activity: Not on file  Stress: Not on file  Social Connections: Not on file  Intimate Partner Violence: Not on file    FAMILY HISTORY: Family History  Problem Relation Age of Onset   Cancer Maternal Aunt        breast   Cancer Maternal Aunt        breast   Cancer Maternal Aunt        pancreatic, lung   Cancer Paternal Aunt        breast   Cancer Paternal Uncle        lung   Cancer Maternal Grandmother        liver and breast    ALLERGIES:  has No Known Allergies.  MEDICATIONS:  Current Outpatient Medications  Medication Sig Dispense Refill   ferrous sulfate 325 (65 FE) MG tablet Take 1 tablet (325 mg total) by mouth daily. 100 tablet 0   No current facility-administered medications for this visit.    REVIEW OF SYSTEMS:   Constitutional: Denies fevers, chills or unintentional weight loss (+) chronic fatigue (+) periodic night sweats Eyes: Denies blurriness of vision, double vision or watery eyes Ears, nose, mouth, throat, and face: Denies epistaxis or sore throat (+) 70-monthhistory of sore on right tongue Respiratory: Denies cough or wheezes (+) exertional dyspnea Cardiovascular: Denies chest discomfort or lower extremity swelling (+) palpitations (+) lightheadedness on standing Gastrointestinal:  Denies nausea, vomiting, constipation, diarrhea, hematochezia, melena, heartburn or change in bowel habits GU/GYN: (+) Menorrhagia (+) dysmenorrhea (+) uterine fibroid Skin: Denies abnormal skin rashes Lymphatics: Denies new lymphadenopathy or easy bruising Neurological:Denies  numbness, tingling or new weaknesses Behavioral/Psych: Mood is stable, no new changes  All other systems were reviewed with the patient and are negative.  PHYSICAL EXAMINATION: ECOG PERFORMANCE STATUS: 1 - Symptomatic but completely ambulatory  Vitals:   06/02/21 1112  BP: 129/85  Pulse: 87  Resp: 18  Temp: 98.2 F (36.8 C)  SpO2: 100%   Filed Weights   06/02/21 1112  Weight: 164 lb 8 oz (74.6 kg)    GENERAL:alert, no distress and comfortable SKIN: No rash EYES: sclera clear OROPHARYNX: No thrush or obvious ulcers NECK: Without mass LYMPH:  no palpable cervical or supraclavicular lymphadenopathy  LUNGS: clear with normal breathing effort HEART: regular rate & rhythm, no murmur, no lower extremity edema ABDOMEN:abdomen soft, non-tender  and normal bowel sounds.  No palpable hepatosplenomegaly Musculoskeletal:no cyanosis of digits and no clubbing  PSYCH: alert & oriented x 3 with fluent speech NEURO: no focal motor/sensory deficits  LABORATORY DATA:  I have reviewed the data as listed    Latest Ref Rng & Units 06/02/2021   11:59 AM 05/26/2021    4:56 PM  CBC  WBC 4.0 - 10.5 K/uL 4.5   7.2    Hemoglobin 12.0 - 15.0 g/dL 7.6   7.2    Hematocrit 36.0 - 46.0 % 27.8   27.7    Platelets 150 - 400 K/uL 272   247         Latest Ref Rng & Units 05/26/2021    4:56 PM  CMP  Glucose 70 - 99 mg/dL 112    BUN 6 - 20 mg/dL 12    Creatinine 0.44 - 1.00 mg/dL 0.85    Sodium 135 - 145 mmol/L 136    Potassium 3.5 - 5.1 mmol/L 4.2    Chloride 98 - 111 mmol/L 106    CO2 22 - 32 mmol/L 22    Calcium 8.9 - 10.3 mg/dL 9.5    Total Protein 6.5 - 8.1 g/dL 7.4    Total Bilirubin 0.3 - 1.2 mg/dL 0.4    Alkaline Phos 38 - 126 U/L 35    AST 15 - 41 U/L 14    ALT 0 - 44 U/L 13       RADIOGRAPHIC STUDIES: I have personally reviewed the radiological images as listed and agreed with the findings in the report. CT ABDOMEN PELVIS W CONTRAST  Result Date: 05/26/2021 CLINICAL DATA:   Fatigue and shortness of breath.  Anemia. EXAM: CT ABDOMEN AND PELVIS WITH CONTRAST TECHNIQUE: Multidetector CT imaging of the abdomen and pelvis was performed using the standard protocol following bolus administration of intravenous contrast. RADIATION DOSE REDUCTION: This exam was performed according to the departmental dose-optimization program which includes automated exposure control, adjustment of the mA and/or kV according to patient size and/or use of iterative reconstruction technique. CONTRAST:  175m OMNIPAQUE IOHEXOL 300 MG/ML  SOLN COMPARISON:  None Available. FINDINGS: Lower chest: Small type 1 hiatal hernia. Hepatobiliary: 4 mm hypodense lesion in the right hepatic lobe on image 25 series 3, technically too small to characterize although statistically highly likely to be benign cyst. Mildly contracted gallbladder. No biliary dilatation. Pancreas: Unremarkable Spleen: Unremarkable Adrenals/Urinary Tract: Unremarkable Stomach/Bowel: Unremarkable Vascular/Lymphatic: Minimal abdominal aortic atherosclerotic calcification. Reproductive: Approximately 8.0 by 4.8 by 7.5 cm (volume = 150 cm^3) mass in the anterior uterine body has an appearance favoring uterine fibroid, and causes some mild flattening/effacement of the endometrium. No similar lesion was observed on the ultrasound from 07/25/2010. Rim enhancing probable corpus luteum in the right ovary on image 70 series 3. Trace free fluid, likely physiologic. Other: No supplemental non-categorized findings. Musculoskeletal: Unremarkable IMPRESSION: 1. 8.0 cm in long axis mass in the anterior uterine body most compatible with fibroid. 2. Small type 1 hiatal hernia. 3. Minimal abdominal aortic atherosclerotic calcification. Electronically Signed   By: WVan ClinesM.D.   On: 05/26/2021 20:47   MM Digital Diagnostic Unilat R  Result Date: 05/30/2021 CLINICAL DATA:  46year old female presenting as a recall from screening for possible right breast  calcifications. EXAM: DIGITAL DIAGNOSTIC UNILATERAL RIGHT MAMMOGRAM TECHNIQUE: Right digital diagnostic mammography was performed. Mammographic images were processed with CAD. COMPARISON:  Previous exam(s). ACR Breast Density Category c: The breast tissue is heterogeneously dense, which may obscure  small masses. FINDINGS: Spot 2D magnification views and full true lateral views of the right breast were performed demonstrating persistence of a small group of predominantly punctate calcifications in the upper outer right breast spanning 0.4 cm. No associated mass or distortion. No new findings elsewhere in the right breast. IMPRESSION: Indeterminate group of calcifications spanning 0.4 cm in the upper outer right breast. RECOMMENDATION: Stereotactic core needle biopsy x1 of the right breast. I have discussed the findings and recommendations with the patient who agrees to proceed with biopsy. The patient will be scheduled for the biopsy appointment prior to leaving the office today. BI-RADS CATEGORY  4: Suspicious. Electronically Signed   By: Audie Pinto M.D.   On: 05/30/2021 16:10   ASSESSMENT & PLAN: 46 year old female  Anemia, likely iron deficiency secondary to menorrhagia -We reviewed her medical record in detail with the patient.  -She had a normal CBC in 2018 and 2019.  Found to have acute moderate anemia Hgb 7.2 at PCP 05/25/2021 and confirmed in the ED.  FOBT negative.   -She began oral iron ~05/26/2021 taking 1 ferrous sulfate and to "body builder" iron tabs daily -We reviewed the other features of her CBC including low HCT 26.2, low MCV 59.4, and elevated RDW 20.8 which are consistent with iron deficiency.  She also has pica -Today's labs show hemoglobin 7.6, HCT 27.8, MCV 61.8, RDW 23.7 with normal platelet count and WBC.  Serum iron elevated 176, TIBC elevated 536, and 33% saturation ratio, ferritin level is pending.   -The source of her IDA is likely menorrhagia.  We will also check  reticulocyte, K99, folic acid, and MMA to rule out nutritional anemia -We recommend IV iron replacement, potential benefit and risk including allergy, even anaphylaxis, were reviewed.  She is interested.  Insurance prefers Venofer, infed, or ferrlecit -We will check her labs 1 month after iron replacement and periodically, to arrange additional IV iron if needed -follow up in 6 months   Fatigue, positional lightheadedness, exertional dyspnea, palpitations -Secondary to #1 -Hgb 7.2 on 05/25/2021  Menorrhagia and dysmenorrhea -Her periods became heavier and painful after her second child in 2006, saturates hygiene products every hour for 2 days and passes clots.  -She was sent to ED 05/26/2021 for anemia, CT AP showed 8 cm uterine fibroid -She tried OCP in the past but did not tolerate, currently in discussion with OB/GYN for partial hysterectomy, likely will do the summer -We discussed the source of her IDA is likely menorrhagia, after hysterectomy this should resolve.  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  -She smoked half pack per day x15 years then vaped for 2 years, quit 04/2021 -Applauded her positive changes and encouraged her to continue healthy active lifestyle -We will check I33 and folic acid to rule out nutritional anemia from alcohol use  Age-appropriate health maintenance -Recently had Pap which was reportedly negative -Diagnostic right mammo 05/30/2021 showed indeterminate group of calcifications spanning 0.4 cm in the upper outer right breast, biopsy scheduled 06/09/2021 -Pending GI consult for colonoscopy 06/16/2021  PLAN: -Lab today -IV Venofer, likely 3 doses over the next 1-2 weeks final dose pending today's ferritin level -Lab 1 month after IV iron replacement then likely 3 months after that -F/up in 6 months  -Pt seen with Dr. Burr Medico     Orders Placed This Encounter  Procedures  CBC with Differential (Cancer Center Only)    Standing Status:   Standing    Number of Occurrences:   20    Standing Expiration Date:   06/03/2022   Ferritin    Standing Status:   Standing    Number of Occurrences:   20    Standing Expiration Date:   06/03/2022   Iron and Iron Binding Capacity ([facility] only)    Standing Status:   Standing    Number of Occurrences:   20    Standing Expiration Date:   06/03/2022   Folate RBC    Standing Status:   Standing    Number of Occurrences:   1    Standing Expiration Date:   06/03/2022   Vitamin B12    Standing Status:   Standing    Number of Occurrences:   1    Standing Expiration Date:   06/03/2022   Methylmalonic acid, serum    Standing Status:   Standing    Number of Occurrences:   1    Standing Expiration Date:   06/03/2022   Retic Panel    Standing Status:   Standing    Number of Occurrences:   1    Standing Expiration Date:   06/03/2022     All questions were answered. The patient knows to call the clinic with any problems, questions or concerns.      Alla Feeling, NP 06/02/21   Addendum I have seen the patient, examined her. I agree with the assessment and and plan and have edited the notes.   46 year old female without significant past medical history, presented with moderate microcytic anemia.  She had a normal CBC with normal MCV in 2019.  She does have menorrhagia secondary to a large fibroid.  This is likely iron deficient anemia, will repeat CBC and check iron study.  She does have moderate alcohol drinking history, will check folic acid and A07 level also.  She started oral iron 1 week ago, repeat lab today showed slightly improved hemoglobin, very low ferritin.  Given her symptomatic anemia, I recommend IV iron about 1g.  Potential side effect of IV iron, especially allergy reaction, including anaphylactic reaction, were discussed with her in detail.  She agrees to proceed.   She plans to have hysterectomy in about 3 months.  I anticipate her anemia and iron deficiency will resolve after hysterectomy.  If not, will obtain additional work-up for anemia and iron deficiency.  All questions were answered.  Truitt Merle  06/02/2021

## 2021-06-01 NOTE — Progress Notes (Signed)
This nurse requested CBC results from the past 5 years from Dr. Anell Barr office at this time.  No further concerns.

## 2021-06-02 ENCOUNTER — Inpatient Hospital Stay: Payer: 59 | Attending: Nurse Practitioner | Admitting: Nurse Practitioner

## 2021-06-02 ENCOUNTER — Encounter: Payer: Self-pay | Admitting: Nurse Practitioner

## 2021-06-02 ENCOUNTER — Telehealth: Payer: Self-pay

## 2021-06-02 ENCOUNTER — Inpatient Hospital Stay: Payer: 59

## 2021-06-02 VITALS — BP 129/85 | HR 87 | Temp 98.2°F | Resp 18 | Ht 72.0 in | Wt 164.5 lb

## 2021-06-02 DIAGNOSIS — Z801 Family history of malignant neoplasm of trachea, bronchus and lung: Secondary | ICD-10-CM | POA: Diagnosis not present

## 2021-06-02 DIAGNOSIS — N946 Dysmenorrhea, unspecified: Secondary | ICD-10-CM | POA: Diagnosis not present

## 2021-06-02 DIAGNOSIS — Z803 Family history of malignant neoplasm of breast: Secondary | ICD-10-CM | POA: Insufficient documentation

## 2021-06-02 DIAGNOSIS — D500 Iron deficiency anemia secondary to blood loss (chronic): Secondary | ICD-10-CM | POA: Diagnosis not present

## 2021-06-02 DIAGNOSIS — Z87891 Personal history of nicotine dependence: Secondary | ICD-10-CM | POA: Insufficient documentation

## 2021-06-02 DIAGNOSIS — Z90710 Acquired absence of both cervix and uterus: Secondary | ICD-10-CM | POA: Diagnosis not present

## 2021-06-02 DIAGNOSIS — F1021 Alcohol dependence, in remission: Secondary | ICD-10-CM | POA: Insufficient documentation

## 2021-06-02 DIAGNOSIS — D259 Leiomyoma of uterus, unspecified: Secondary | ICD-10-CM | POA: Insufficient documentation

## 2021-06-02 DIAGNOSIS — Z800 Family history of malignant neoplasm of digestive organs: Secondary | ICD-10-CM | POA: Diagnosis not present

## 2021-06-02 DIAGNOSIS — N920 Excessive and frequent menstruation with regular cycle: Secondary | ICD-10-CM | POA: Insufficient documentation

## 2021-06-02 LAB — CBC WITH DIFFERENTIAL (CANCER CENTER ONLY)
Abs Immature Granulocytes: 0.01 10*3/uL (ref 0.00–0.07)
Basophils Absolute: 0.1 10*3/uL (ref 0.0–0.1)
Basophils Relative: 1 %
Eosinophils Absolute: 0.1 10*3/uL (ref 0.0–0.5)
Eosinophils Relative: 2 %
HCT: 27.8 % — ABNORMAL LOW (ref 36.0–46.0)
Hemoglobin: 7.6 g/dL — ABNORMAL LOW (ref 12.0–15.0)
Immature Granulocytes: 0 %
Lymphocytes Relative: 26 %
Lymphs Abs: 1.2 10*3/uL (ref 0.7–4.0)
MCH: 16.9 pg — ABNORMAL LOW (ref 26.0–34.0)
MCHC: 27.3 g/dL — ABNORMAL LOW (ref 30.0–36.0)
MCV: 61.8 fL — ABNORMAL LOW (ref 80.0–100.0)
Monocytes Absolute: 0.4 10*3/uL (ref 0.1–1.0)
Monocytes Relative: 9 %
Neutro Abs: 2.8 10*3/uL (ref 1.7–7.7)
Neutrophils Relative %: 62 %
Platelet Count: 272 10*3/uL (ref 150–400)
RBC: 4.5 MIL/uL (ref 3.87–5.11)
RDW: 23.7 % — ABNORMAL HIGH (ref 11.5–15.5)
Smear Review: NORMAL
WBC Count: 4.5 10*3/uL (ref 4.0–10.5)
nRBC: 0 % (ref 0.0–0.2)

## 2021-06-02 LAB — RETIC PANEL
Immature Retic Fract: 38.6 % — ABNORMAL HIGH (ref 2.3–15.9)
RBC.: 4.48 MIL/uL (ref 3.87–5.11)
Retic Count, Absolute: 78.8 10*3/uL (ref 19.0–186.0)
Retic Ct Pct: 1.8 % (ref 0.4–3.1)
Reticulocyte Hemoglobin: 22.8 pg — ABNORMAL LOW (ref >27.9)

## 2021-06-02 LAB — IRON AND IRON BINDING CAPACITY (CC-WL,HP ONLY)
Iron: 176 ug/dL — ABNORMAL HIGH (ref 28–170)
Saturation Ratios: 33 % — ABNORMAL HIGH (ref 10.4–31.8)
TIBC: 536 ug/dL — ABNORMAL HIGH (ref 250–450)
UIBC: 360 ug/dL (ref 148–442)

## 2021-06-02 LAB — FERRITIN: Ferritin: 3 ng/mL — ABNORMAL LOW (ref 11–307)

## 2021-06-02 LAB — VITAMIN B12: Vitamin B-12: 669 pg/mL (ref 180–914)

## 2021-06-02 NOTE — Telephone Encounter (Signed)
This nurse called and made a second request for patients CBC results from the past 5 years.  The receptionist stated that she would get the request to the medical records clerk to have results faxed.  No further questions or concerns at this time.

## 2021-06-03 ENCOUNTER — Telehealth: Payer: Self-pay | Admitting: Nurse Practitioner

## 2021-06-03 LAB — FOLATE RBC
Folate, Hemolysate: 477 ng/mL
Folate, RBC: 1680 ng/mL (ref >498)
Hematocrit: 28.4 % — ABNORMAL LOW (ref 34.0–46.6)

## 2021-06-03 NOTE — Telephone Encounter (Signed)
.  Called pt per 5/26 inbasket , Patient was unavailable, a message with appt time and date was left with number on file.

## 2021-06-06 LAB — METHYLMALONIC ACID, SERUM: Methylmalonic Acid, Quantitative: 168 nmol/L (ref 0–378)

## 2021-06-09 ENCOUNTER — Ambulatory Visit
Admission: RE | Admit: 2021-06-09 | Discharge: 2021-06-09 | Disposition: A | Discharge status: Home / Self Care | Payer: 59 | Source: Ambulatory Visit | Attending: Obstetrics and Gynecology | Admitting: Obstetrics and Gynecology

## 2021-06-09 DIAGNOSIS — R928 Other abnormal and inconclusive findings on diagnostic imaging of breast: Secondary | ICD-10-CM

## 2021-06-10 ENCOUNTER — Encounter: Payer: Self-pay | Admitting: *Deleted

## 2021-06-15 ENCOUNTER — Other Ambulatory Visit: Payer: Self-pay

## 2021-06-15 ENCOUNTER — Encounter: Payer: Self-pay | Admitting: Nurse Practitioner

## 2021-06-15 ENCOUNTER — Inpatient Hospital Stay: Payer: 59 | Attending: Nurse Practitioner

## 2021-06-15 VITALS — BP 124/78 | HR 75 | Temp 98.3°F | Resp 17 | Wt 161.5 lb

## 2021-06-15 DIAGNOSIS — D500 Iron deficiency anemia secondary to blood loss (chronic): Secondary | ICD-10-CM | POA: Diagnosis present

## 2021-06-15 DIAGNOSIS — N920 Excessive and frequent menstruation with regular cycle: Secondary | ICD-10-CM | POA: Insufficient documentation

## 2021-06-15 MED ORDER — SODIUM CHLORIDE 0.9 % IV SOLN: Freq: Once | INTRAVENOUS | Status: AC

## 2021-06-15 MED ORDER — SODIUM CHLORIDE 0.9 % IV SOLN
300.0000 mg | Freq: Once | INTRAVENOUS | Status: AC
  Administered 2021-06-15: 300 mg via INTRAVENOUS
  Filled 2021-06-15: qty 300

## 2021-06-15 MED ORDER — LORATADINE 10 MG PO TABS
10.0000 mg | ORAL_TABLET | Freq: Once | ORAL | Status: AC
  Administered 2021-06-15: 10 mg via ORAL
  Filled 2021-06-15: qty 1

## 2021-06-15 NOTE — Patient Instructions (Signed)

## 2021-06-16 ENCOUNTER — Encounter: Payer: Self-pay | Admitting: Physician Assistant

## 2021-06-16 ENCOUNTER — Ambulatory Visit (INDEPENDENT_AMBULATORY_CARE_PROVIDER_SITE_OTHER): Payer: 59 | Admitting: Physician Assistant

## 2021-06-16 VITALS — BP 110/68 | HR 93 | Ht 72.0 in | Wt 165.0 lb

## 2021-06-16 DIAGNOSIS — K625 Hemorrhage of anus and rectum: Secondary | ICD-10-CM | POA: Diagnosis not present

## 2021-06-16 DIAGNOSIS — D509 Iron deficiency anemia, unspecified: Secondary | ICD-10-CM | POA: Diagnosis not present

## 2021-06-16 MED ORDER — PLENVU 140 G PO SOLR: 1.0000 | ORAL | 0 refills | Status: DC

## 2021-06-16 NOTE — Patient Instructions (Signed)
You have been scheduled for an endoscopy and colonoscopy. Please follow the written instructions given to you at your visit today. Please pick up your prep supplies at the pharmacy within the next 1-3 days. If you use inhalers (even only as needed), please bring them with you on the day of your procedure.  Continue iron supplementation (with exception of 5 days prior to test)  Follow up depending on colonoscopy recommendations.  If you are age 46 or older, your body mass index should be between 23-30. Your Body mass index is 22.38 kg/m. If this is out of the aforementioned range listed, please consider follow up with your Primary Care Provider.  If you are age 48 or younger, your body mass index should be between 19-25. Your Body mass index is 22.38 kg/m. If this is out of the aformentioned range listed, please consider follow up with your Primary Care Provider.   ________________________________________________________  The [facility] [facility] providers would like to encourage you to use [facility] to communicate with providers for non-urgent requests or questions.  Due to long hold times on the telephone, sending your provider a message by [facility] may be a faster and more efficient way to get a response.  Please allow 48 business hours for a response.  Please remember that this is for non-urgent requests.  _______________________________________________________  Due to recent changes in healthcare laws, you may see the results of your imaging and laboratory studies on MyChart before your provider has had a chance to review them.  We understand that in some cases there may be results that are confusing or concerning to you. Not all laboratory results come back in the same time frame and the provider may be waiting for multiple results in order to interpret others.  Please give [place] 48 hours in order for your provider to thoroughly review all the results before contacting the office for clarification of your  results.

## 2021-06-16 NOTE — Progress Notes (Signed)
Attending Physician's Attestation   I have reviewed the chart.   I agree with the Advanced Practitioner's note, impression, and recommendations with any updates as below.    Kweku Stankey Mansouraty, MD [facility] Advanced [facility]oscopy Office # [phone]  

## 2021-06-16 NOTE — Progress Notes (Signed)
Chief Complaint: Abdominal pain, hemorrhoids, discuss colonoscopy  HPI:    Janice Lawrence is a 46 year old female who was referred to me by No ref. provider found for a complaint of abdominal pain, hemorrhoids and to discuss a colonoscopy.      06/02/2021 patient seen by hematology oncology for iron deficiency anemia.  At that time noted that she had a hemoglobin of 7.2 on 05/25/2021.  FOBT was negative.  CTAP 05/26/2021 with a 4 mm hypodense right liver lesion felt to be a benign cyst and an 8 x 4.8 x 7.5 cm mass in the anterior uterine body favoring a fibroid.  She has been started on oral iron a week ago.  That time noted that anemia was likely secondary to menorrhagia.  She had further labs.  She was recommended to have IV iron replacement.    Today, the patient presents to clinic and tells me that she had not been to see her various physicians over the past couple of years and is trying to get caught up with everything.  She went to see her PCP and had labs that showed she was severely anemic.  She was sent to the ER with a CT as above which really did not show anything.  Has since also been seen by an ENT for a lesion on her tongue which has been biopsied.  Has also been for a mammogram with a breast biopsy and is following with hematology oncology in regards to iron infusions which she has scheduled.  Tells me GI wise she has very occasional reflux and has noticed some hemorrhoidal bleeding in the past with bright red blood on the tissue paper, but has never really had any problems.  Tells me since starting the iron supplementation her stools are little different, but nothing alarming.    Does report history of heavy periods, but this has been her whole life and she does not feel like this can be the only reason she has a low hemoglobin now.    Denies fever, chills, weight loss or symptoms that awaken her from sleep.  Past Medical History:  Diagnosis Date   Anemia    Hiatal hernia    Uterine fibroid      No past surgical history on file.  Current Outpatient Medications  Medication Sig Dispense Refill   ferrous sulfate 325 (65 FE) MG tablet Take 1 tablet (325 mg total) by mouth daily. 100 tablet 0   No current facility-administered medications for this visit.    Allergies as of 06/16/2021   (No Known Allergies)    Family History  Problem Relation Age of Onset   Cancer Maternal Aunt        breast   Cancer Maternal Aunt        breast   Cancer Maternal Aunt        pancreatic, lung   Cancer Paternal Aunt        breast   Cancer Paternal Uncle        lung   Cancer Maternal Grandmother        liver and breast    Social History   Socioeconomic History   Marital status: Married    Spouse name: Not on file   Number of children: 2   Years of education: Not on file   Highest education level: Not on file  Occupational History   Not on file  Tobacco Use   Smoking status: Former    Packs/day: 0.50  Years: 15.00    Total pack years: 7.50    Types: Cigarettes    Quit date: 01/10/2020    Years since quitting: 1.4   Smokeless tobacco: Not on file  Vaping Use   Vaping Use: Former   Quit date: 04/09/2021  Substance and Sexual Activity   Alcohol use: Not Currently    Comment: 2 bottles daily x10 years, quit 01/10/20   Drug use: Not on file    Comment: [place], stopped 05/09/21   Sexual activity: Yes  Other Topics Concern   Not on file  Social History Narrative   Not on file   Social Determinants of Health   Financial Resource Strain: Not on file  Food Insecurity: Not on file  Transportation Needs: Not on file  Physical Activity: Not on file  Stress: Not on file  Social Connections: Not on file  Intimate Partner Violence: Not on file    Review of Systems:    Constitutional: No weight loss, fever or chills Skin: No rash Cardiovascular: No chest pain  Respiratory: No SOB  Gastrointestinal: See HPI and otherwise negative Genitourinary: No dysuria   Neurological: No headache, dizziness or syncope Musculoskeletal: No new muscle or joint pain Hematologic: No bleeding  Psychiatric: No history of depression or anxiety   Physical Exam:  Vital signs: BP 110/68   Pulse 93   Ht 6' (1.829 m)   Wt 165 lb (74.8 kg)   LMP 06/05/2021 (Exact Date)   BMI 22.38 kg/m    Constitutional:   Pleasant Caucasian female appears to be in NAD, Well developed, Well nourished, alert and cooperative Head:  Normocephalic and atraumatic. Eyes:   PEERL, EOMI. No icterus. Conjunctiva pink. Ears:  Normal auditory acuity. Neck:  Supple Throat: Oral cavity and pharynx without inflammation, swelling or lesion.  Respiratory: Respirations even and unlabored. Lungs clear to auscultation bilaterally.   No wheezes, crackles, or rhonchi.  Cardiovascular: Normal S1, S2. No MRG. Regular rate and rhythm. No peripheral edema, cyanosis or pallor.  Gastrointestinal:  Soft, nondistended, nontender. No rebound or guarding. Normal bowel sounds. No appreciable masses or hepatomegaly. Rectal:  Not performed.  Msk:  Symmetrical without gross deformities. Without edema, no deformity or joint abnormality.  Neurologic:  Alert and  oriented x4;  grossly normal neurologically.  Skin:   Dry and intact without significant lesions or rashes. Psychiatric: Demonstrates good judgement and reason without abnormal affect or behaviors.  RELEVANT LABS AND IMAGING: CBC    Component Value Date/Time   WBC 4.5 06/02/2021 1159   WBC 7.2 05/26/2021 1656   RBC 4.48 06/02/2021 1159   RBC 4.50 06/02/2021 1159   HGB 7.6 (L) 06/02/2021 1159   HCT 28.4 (L) 06/02/2021 1200   HCT 27.8 (L) 06/02/2021 1159   PLT 272 06/02/2021 1159   MCV 61.8 (L) 06/02/2021 1159   MCH 16.9 (L) 06/02/2021 1159   MCHC 27.3 (L) 06/02/2021 1159   RDW 23.7 (H) 06/02/2021 1159   LYMPHSABS 1.2 06/02/2021 1159   MONOABS 0.4 06/02/2021 1159   EOSABS 0.1 06/02/2021 1159   BASOSABS 0.1 06/02/2021 1159    CMP      Component Value Date/Time   NA 136 05/26/2021 1656   K 4.2 05/26/2021 1656   CL 106 05/26/2021 1656   CO2 22 05/26/2021 1656   GLUCOSE 112 (H) 05/26/2021 1656   BUN 12 05/26/2021 1656   CREATININE 0.85 05/26/2021 1656   CALCIUM 9.5 05/26/2021 1656   PROT 7.4 05/26/2021 1656  ALBUMIN 4.2 05/26/2021 1656   AST 14 (L) 05/26/2021 1656   ALT 13 05/26/2021 1656   ALKPHOS 35 (L) 05/26/2021 1656   BILITOT 0.4 05/26/2021 1656   GFRNONAA >60 05/26/2021 1656    Assessment: 1.  Iron deficiency anemia: Hemoglobin recently found to be in the 7's, has since followed with hematology oncology have set her up for infusions and think that her IDA may be related to menorrhagia; consider menorrhagia versus GI source versus other 2.  Rectal bleeding: Occasional bright red blood in the toilet paper on wiping, has always blamed this on hemorrhoids  Plan: 1.  Continue to follow with hematology oncology in regards to iron deficiency anemia and iron infusions. 2.  Scheduled patient for diagnostic EGD and colonoscopy in the [place] with Dr. Rush Landmark as he had sooner availability.  Did provide the patient a detailed list of risks for the procedures and she agrees to proceed.  Patient will continue to follow with Dr. Rush Landmark as her primary GI physician here after procedures. Patient is appropriate for endoscopic procedure(s) in the ambulatory ([place]) setting.  3.  Patient to follow in clinic per recommendations after above.  Ellouise Newer, PA-C [facility] 06/16/2021, 9:04 AM

## 2021-06-17 ENCOUNTER — Encounter: Payer: Self-pay | Admitting: Gastroenterology

## 2021-06-20 ENCOUNTER — Other Ambulatory Visit: Payer: Self-pay

## 2021-06-20 ENCOUNTER — Inpatient Hospital Stay: Payer: 59

## 2021-06-20 VITALS — BP 116/74 | HR 84 | Temp 98.0°F | Resp 18 | Wt 161.5 lb

## 2021-06-20 DIAGNOSIS — D500 Iron deficiency anemia secondary to blood loss (chronic): Secondary | ICD-10-CM

## 2021-06-20 MED ORDER — SODIUM CHLORIDE 0.9 % IV SOLN
300.0000 mg | Freq: Once | INTRAVENOUS | Status: AC
  Administered 2021-06-20: 300 mg via INTRAVENOUS
  Filled 2021-06-20: qty 300

## 2021-06-20 MED ORDER — LORATADINE 10 MG PO TABS
10.0000 mg | ORAL_TABLET | Freq: Every day | ORAL | Status: DC
  Administered 2021-06-20: 10 mg via ORAL
  Filled 2021-06-20: qty 1

## 2021-06-20 MED ORDER — SODIUM CHLORIDE 0.9 % IV SOLN: Freq: Once | INTRAVENOUS | Status: AC

## 2021-06-20 NOTE — Patient Instructions (Signed)

## 2021-06-20 NOTE — Progress Notes (Signed)
Patient declined to stay 30 minute observation period. Patient tolerated second iron treatment well. VSS. Patient discharged ambulatory.

## 2021-06-22 ENCOUNTER — Encounter: Payer: Self-pay | Admitting: Gastroenterology

## 2021-06-22 ENCOUNTER — Ambulatory Visit (AMBULATORY_SURGERY_CENTER): Payer: 59 | Admitting: Gastroenterology

## 2021-06-22 VITALS — BP 108/81 | HR 68 | Temp 96.8°F | Resp 17 | Ht 72.0 in | Wt 165.0 lb

## 2021-06-22 DIAGNOSIS — K2980 Duodenitis without bleeding: Secondary | ICD-10-CM

## 2021-06-22 DIAGNOSIS — K2970 Gastritis, unspecified, without bleeding: Secondary | ICD-10-CM | POA: Diagnosis not present

## 2021-06-22 DIAGNOSIS — D509 Iron deficiency anemia, unspecified: Secondary | ICD-10-CM | POA: Diagnosis present

## 2021-06-22 DIAGNOSIS — K625 Hemorrhage of anus and rectum: Secondary | ICD-10-CM

## 2021-06-22 DIAGNOSIS — K641 Second degree hemorrhoids: Secondary | ICD-10-CM | POA: Diagnosis not present

## 2021-06-22 DIAGNOSIS — R12 Heartburn: Secondary | ICD-10-CM | POA: Diagnosis not present

## 2021-06-22 MED ORDER — SODIUM CHLORIDE 0.9 % IV SOLN: 500.0000 mL | Freq: Once | INTRAVENOUS | Status: DC

## 2021-06-22 MED ORDER — HYDROCORTISONE ACETATE 25 MG RE SUPP: 25.0000 mg | Freq: Every evening | RECTAL | 1 refills | Status: AC

## 2021-06-22 NOTE — Progress Notes (Signed)
GASTROENTEROLOGY PROCEDURE H&P NOTE   Primary Care Physician: Carlyon Shadow, MD  HPI: Janice Lawrence is a 46 y.o. female who presents for EGD/Colonoscopy for IDA/Abdominal Pain/Hemorrhoids/Colon Cancer Screening.  Past Medical History:  Diagnosis Date   Anemia    Hiatal hernia    Uterine fibroid    Past Surgical History:  Procedure Laterality Date   breast argumentation     Current Outpatient Medications  Medication Sig Dispense Refill   ferrous sulfate 325 (65 FE) MG tablet Take 1 tablet (325 mg total) by mouth daily. (Patient not taking: Reported on 06/16/2021) 100 tablet 0   iron dextran complex 1 mg in sodium chloride 0.9 % 500 mL Inject 1 mg into the vein every 14 (fourteen) days.     PEG-KCl-NaCl-NaSulf-Na Asc-C (PLENVU) 140 g SOLR Take 1 kit by mouth as directed. Use coupon: BIN: [id number] PNC: CNRX Group: [id number] ID: [id number] 1 each 0   No current facility-administered medications for this visit.    Current Outpatient Medications:    ferrous sulfate 325 (65 FE) MG tablet, Take 1 tablet (325 mg total) by mouth daily. (Patient not taking: Reported on 06/16/2021), Disp: 100 tablet, Rfl: 0   iron dextran complex 1 mg in sodium chloride 0.9 % 500 mL, Inject 1 mg into the vein every 14 (fourteen) days., Disp: , Rfl:    PEG-KCl-NaCl-NaSulf-Na Asc-C (PLENVU) 140 g SOLR, Take 1 kit by mouth as directed. Use coupon: BIN: [id number] PNC: CNRX Group: [id number] ID: [id number], Disp: 1 each, Rfl: 0 No Known Allergies Family History  Problem Relation Age of Onset   Diabetes Father    Cancer Maternal Grandmother        liver and breast   Cancer Maternal Aunt        breast   Cancer Maternal Aunt        breast   Cancer Maternal Aunt        pancreatic, lung   Cancer Paternal Aunt        breast   Cancer Paternal Uncle        lung   Esophageal cancer Neg Hx    Colon cancer Neg Hx    Social History   Socioeconomic History   Marital status: Married    Spouse name: Not on  file   Number of children: 2   Years of education: Not on file   Highest education level: Not on file  Occupational History   Occupation: Geologist, engineering  Tobacco Use   Smoking status: Former    Packs/day: 0.50    Years: 15.00    Total pack years: 7.50    Types: Cigarettes    Quit date: 01/10/2020    Years since quitting: 1.4   Smokeless tobacco: Never  Vaping Use   Vaping Use: Former   Quit date: 04/09/2021  Substance and Sexual Activity   Alcohol use: Not Currently    Comment: 2 bottles daily x10 years, quit 01/10/20   Drug use: Not on file    Comment: [place], stopped 05/09/21   Sexual activity: Yes  Other Topics Concern   Not on file  Social History Narrative   Not on file   Social Determinants of Health   Financial Resource Strain: Not on file  Food Insecurity: Not on file  Transportation Needs: Not on file  Physical Activity: Not on file  Stress: Not on file  Social Connections: Not on file  Intimate Partner Violence: Not on file    Physical  Exam: There were no vitals filed for this visit. There is no height or weight on file to calculate BMI. GEN: NAD EYE: Sclerae anicteric ENT: MMM CV: Non-tachycardic GI: Soft, NT/ND NEURO:  Alert & Oriented x 3  Lab Results: No results for input(s): "WBC", "HGB", "HCT", "PLT" in the last 72 hours. BMET No results for input(s): "NA", "K", "CL", "CO2", "GLUCOSE", "BUN", "CREATININE", "CALCIUM" in the last 72 hours. LFT No results for input(s): "PROT", "ALBUMIN", "AST", "ALT", "ALKPHOS", "BILITOT", "BILIDIR", "IBILI" in the last 72 hours. PT/INR No results for input(s): "LABPROT", "INR" in the last 72 hours.   Impression / Plan: This is a 46 y.o.female who presents for EGD/Colonoscopy for IDA/Abdominal Pain/Hemorrhoids/Colon Cancer Screening.  The risks and benefits of endoscopic evaluation/treatment were discussed with the patient and/or family; these include but are not limited to the risk of perforation,  infection, bleeding, missed lesions, lack of diagnosis, severe illness requiring hospitalization, as well as anesthesia and sedation related illnesses.  The patient's history has been reviewed, patient examined, no change in status, and deemed stable for procedure.  The patient and/or family is agreeable to proceed.    Justice Britain, MD [facility] Advanced Endoscopy Office # [phone]

## 2021-06-22 NOTE — Op Note (Signed)
[place] Patient Name: Janice Lawrence Procedure Date: 06/22/2021 1:15 PM MRN: [id number] Endoscopist: Justice Britain , MD Age: 46 Referring MD:  Date of Birth: 12-09-1975 Gender: Female Account #: [id number] Procedure:                Colonoscopy Indications:              Screening for colorectal malignant neoplasm,                            Incidental - Iron deficiency anemia Medicines:                Monitored Anesthesia Care Procedure:                Pre-Anesthesia Assessment:                           - Prior to the procedure, a History and Physical                            was performed, and patient medications and                            allergies were reviewed. The patient's tolerance of                            previous anesthesia was also reviewed. The risks                            and benefits of the procedure and the sedation                            options and risks were discussed with the patient.                            All questions were answered, and informed consent                            was obtained. Prior Anticoagulants: The patient has                            taken no previous anticoagulant or antiplatelet                            agents. ASA Grade Assessment: II - A patient with                            mild systemic disease. After reviewing the risks                            and benefits, the patient was deemed in                            satisfactory condition to undergo the procedure.  After obtaining informed consent, the colonoscope                            was passed under direct vision. Throughout the                            procedure, the patient's blood pressure, pulse, and                            oxygen saturations were monitored continuously. The                            PCF-HQ190L Colonoscope was introduced through the                            anus and advanced to the 5  cm into the ileum. The                            quality of the bowel preparation was good. The                            terminal ileum, ileocecal valve, appendiceal                            orifice, and rectum were photographed. Scope In: 1:43:23 PM Scope Out: 1:55:40 PM Scope Withdrawal Time: 0 hours 8 minutes 32 seconds  Total Procedure Duration: 0 hours 12 minutes 17 seconds  Findings:                 The digital rectal exam findings include                            hemorrhoids. Pertinent negatives include no                            palpable rectal lesions.                           The terminal ileum and ileocecal valve appeared                            normal.                           Normal mucosa was found in the entire colon.                           Non-bleeding non-thrombosed external and internal                            hemorrhoids were found during retroflexion, during                            perianal exam and during digital exam. The  hemorrhoids were Grade II (internal hemorrhoids                            that prolapse but reduce spontaneously). Complications:            No immediate complications. Estimated Blood Loss:     Estimated blood loss: none. Impression:               - Hemorrhoids found on digital rectal exam.                           - The examined portion of the ileum was normal.                           - Normal mucosa in the entire examined colon.                           - Non-bleeding non-thrombosed external and internal                            hemorrhoids. Recommendation:           - The patient will be observed post-procedure,                            until all discharge criteria are met.                           - Discharge patient to home.                           - Patient has a contact number available for                            emergencies. The signs and symptoms of potential                             delayed complications were discussed with the                            patient. Return to normal activities tomorrow.                            Written discharge instructions were provided to the                            patient.                           - Low fiber diet.                           - Use FiberCon 1-2 tablets PO daily.                           - Continue present medications.                           -  Repeat colonoscopy in 10 years for screening                            purposes.                           - As per notation, patient still having menstrual                            bleeding, I believe her IDA most likely a result of                            GYN related issues. Would not necessarily advocate                            for VCE at this time, but if in future, IDA                            persists or progresses beyond Hematology concerns,                            we can pursue VCE.                           - The findings and recommendations were discussed                            with the patient.                           - The findings and recommendations were discussed                            with the patient's family. Justice Britain, MD 06/22/2021 2:05:07 PM

## 2021-06-22 NOTE — Op Note (Signed)
[place] Patient Name: Janice Lawrence Procedure Date: 06/22/2021 1:16 PM MRN: [id number] Endoscopist: Justice Britain , MD Age: 46 Referring MD:  Date of Birth: 1975-11-14 Gender: Female Account #: [id number] Procedure:                Upper GI endoscopy Indications:              Iron deficiency anemia, Heartburn Medicines:                Monitored Anesthesia Care Procedure:                Pre-Anesthesia Assessment:                           - Prior to the procedure, a History and Physical                            was performed, and patient medications and                            allergies were reviewed. The patient's tolerance of                            previous anesthesia was also reviewed. The risks                            and benefits of the procedure and the sedation                            options and risks were discussed with the patient.                            All questions were answered, and informed consent                            was obtained. Prior Anticoagulants: The patient has                            taken no previous anticoagulant or antiplatelet                            agents. ASA Grade Assessment: II - A patient with                            mild systemic disease. After reviewing the risks                            and benefits, the patient was deemed in                            satisfactory condition to undergo the procedure.                           After obtaining informed consent, the endoscope was  passed under direct vision. Throughout the                            procedure, the patient's blood pressure, pulse, and                            oxygen saturations were monitored continuously. The                            Endoscope was introduced through the mouth, and                            advanced to the second part of duodenum. The upper                            GI endoscopy was  accomplished without difficulty.                            The patient tolerated the procedure. Scope In: Scope Out: Findings:                 No gross lesions were noted in the entire esophagus.                           The Z-line was regular and was found 41 cm from the                            incisors.                           A 2 cm hiatal hernia was present.                           Patchy mildly erythematous mucosa without bleeding                            was found in the entire examined stomach. Biopsies                            were taken with a cold forceps for histology and                            Helicobacter pylori testing.                           No gross lesions were noted in the duodenal bulb,                            in the first portion of the duodenum and in the                            second portion of the duodenum. Biopsies for  histology were taken with a cold forceps for                            evaluation of celiac disease. Complications:            No immediate complications. Estimated Blood Loss:     Estimated blood loss was minimal. Impression:               - No gross lesions in esophagus. Z-line regular, 41                            cm from the incisors.                           - 2 cm hiatal hernia.                           - Erythematous mucosa in the stomach. Biopsied.                           - No gross lesions in the duodenal bulb, in the                            first portion of the duodenum and in the second                            portion of the duodenum. Biopsied. Recommendation:           - Proceed to scheduled colonoscopy.                           - Continue present medications.                           - Await pathology results.                           - The findings and recommendations were discussed                            with the patient.                           - The  findings and recommendations were discussed                            with the patient's family. Justice Britain, MD 06/22/2021 2:00:19 PM

## 2021-06-22 NOTE — Patient Instructions (Signed)
Handout on gastritis, hemorrhoids and high fiber diet given.  High fiber diet.  Use FiberCon 1-2 tablets by mouth daily.  Await pathology results.  YOU HAD AN ENDOSCOPIC PROCEDURE TODAY AT [facility]:   Refer to the procedure report that was given to you for any specific questions about what was found during the examination.  If the procedure report does not answer your questions, please call your gastroenterologist to clarify.  If you requested that your care partner not be given the details of your procedure findings, then the procedure report has been included in a sealed envelope for you to review at your convenience later.  YOU SHOULD EXPECT: Some feelings of bloating in the abdomen. Passage of more gas than usual.  Walking can help get rid of the air that was put into your GI tract during the procedure and reduce the bloating. If you had a lower endoscopy (such as a colonoscopy or flexible sigmoidoscopy) you may notice spotting of blood in your stool or on the toilet paper. If you underwent a bowel prep for your procedure, you may not have a normal bowel movement for a few days.  Please Note:  You might notice some irritation and congestion in your nose or some drainage.  This is from the oxygen used during your procedure.  There is no need for concern and it should clear up in a day or so.  SYMPTOMS TO REPORT IMMEDIATELY:  Following lower endoscopy (colonoscopy or flexible sigmoidoscopy):  Excessive amounts of blood in the stool  Significant tenderness or worsening of abdominal pains  Swelling of the abdomen that is new, acute  Fever of 100F or higher  Following upper endoscopy (EGD)  Vomiting of blood or coffee ground material  New chest pain or pain under the shoulder blades  Painful or persistently difficult swallowing  New shortness of breath  Fever of 100F or higher  Black, tarry-looking stools  For urgent or emergent issues, a gastroenterologist can be  reached at any hour by calling [phone]. Do not use MyChart messaging for urgent concerns.    DIET:  We do recommend a small meal at first, but then you may proceed to your regular diet.  Drink plenty of fluids but you should avoid alcoholic beverages for 24 hours.  ACTIVITY:  You should plan to take it easy for the rest of today and you should NOT DRIVE or use heavy machinery until tomorrow (because of the sedation medicines used during the test).    FOLLOW UP: Our staff will call the number listed on your records 24-72 hours following your procedure to check on you and address any questions or concerns that you may have regarding the information given to you following your procedure. If we do not reach you, we will leave a message.  We will attempt to reach you two times.  During this call, we will ask if you have developed any symptoms of COVID 19. If you develop any symptoms (ie: fever, flu-like symptoms, shortness of breath, cough etc.) before then, please call [phone].  If you test positive for Covid 19 in the 2 weeks post procedure, please call and report this information to [place].    If any biopsies were taken you will be contacted by phone or by letter within the next 1-3 weeks.  Please call us at [phone] if you have not heard about the biopsies in 3 weeks.    SIGNATURES/CONFIDENTIALITY: You and/or your care partner have  signed paperwork which will be entered into your electronic medical record.  These signatures attest to the fact that that the information above on your After Visit Summary has been reviewed and is understood.  Full responsibility of the confidentiality of this discharge information lies with you and/or your care-partner.

## 2021-06-22 NOTE — Progress Notes (Signed)
Called to room to assist during endoscopic procedure.  Patient ID and intended procedure confirmed with present staff. Received instructions for my participation in the procedure from the performing physician.  

## 2021-06-22 NOTE — Progress Notes (Signed)
Sedate, gd SR, tolerated procedure well, VSS, report to RN 

## 2021-06-23 ENCOUNTER — Telehealth: Payer: Self-pay | Admitting: *Deleted

## 2021-06-23 ENCOUNTER — Telehealth: Payer: Self-pay

## 2021-06-23 NOTE — Telephone Encounter (Signed)
  Follow up Call-     06/22/2021   12:51 PM  Call back number  Post procedure Call Back phone  # [phone]  Permission to leave phone message Yes     Patient questions:  Do you have a fever, pain , or abdominal swelling? No. Pain Score  0 *  Have you tolerated food without any problems? Yes.    Have you been able to return to your normal activities? Yes.    Do you have any questions about your discharge instructions: Diet   No. Medications  No. Follow up visit  No.  Do you have questions or concerns about your Care? No.  Actions: * If pain score is 4 or above: No action needed, pain <4.

## 2021-06-23 NOTE — Telephone Encounter (Signed)
Left message on follow up call. 

## 2021-06-24 ENCOUNTER — Other Ambulatory Visit: Payer: Self-pay

## 2021-06-24 ENCOUNTER — Inpatient Hospital Stay: Payer: 59

## 2021-06-24 VITALS — BP 121/83 | HR 62 | Temp 98.2°F | Resp 16

## 2021-06-24 DIAGNOSIS — D500 Iron deficiency anemia secondary to blood loss (chronic): Secondary | ICD-10-CM

## 2021-06-24 MED ORDER — LORATADINE 10 MG PO TABS: 10.0000 mg | ORAL_TABLET | Freq: Every day | ORAL | Status: DC

## 2021-06-24 MED ORDER — SODIUM CHLORIDE 0.9 % IV SOLN: Freq: Once | INTRAVENOUS | Status: AC

## 2021-06-24 MED ORDER — SODIUM CHLORIDE 0.9 % IV SOLN
300.0000 mg | Freq: Once | INTRAVENOUS | Status: AC
  Administered 2021-06-24: 300 mg via INTRAVENOUS
  Filled 2021-06-24: qty 200

## 2021-06-24 NOTE — Patient Instructions (Signed)

## 2021-06-24 NOTE — Progress Notes (Signed)
Patient declined 30 minute observation period.  VSS.  No s/s or c/o distress or discomfort.

## 2021-06-27 ENCOUNTER — Ambulatory Visit: Payer: 59

## 2021-06-29 ENCOUNTER — Encounter: Payer: Self-pay | Admitting: Gastroenterology

## 2021-07-15 ENCOUNTER — Inpatient Hospital Stay: Payer: 59

## 2021-07-29 ENCOUNTER — Inpatient Hospital Stay: Payer: 59 | Attending: Nurse Practitioner

## 2021-07-29 ENCOUNTER — Other Ambulatory Visit: Payer: Self-pay

## 2021-07-29 DIAGNOSIS — D509 Iron deficiency anemia, unspecified: Secondary | ICD-10-CM | POA: Diagnosis present

## 2021-07-29 DIAGNOSIS — D500 Iron deficiency anemia secondary to blood loss (chronic): Secondary | ICD-10-CM

## 2021-07-29 LAB — CBC WITH DIFFERENTIAL (CANCER CENTER ONLY)
Abs Immature Granulocytes: 0.01 10*3/uL (ref 0.00–0.07)
Basophils Absolute: 0.1 10*3/uL (ref 0.0–0.1)
Basophils Relative: 1 %
Eosinophils Absolute: 0.3 10*3/uL (ref 0.0–0.5)
Eosinophils Relative: 5 %
HCT: 41.9 % (ref 36.0–46.0)
Hemoglobin: 13.4 g/dL (ref 12.0–15.0)
Immature Granulocytes: 0 %
Lymphocytes Relative: 21 %
Lymphs Abs: 1.2 10*3/uL (ref 0.7–4.0)
MCH: 25.5 pg — ABNORMAL LOW (ref 26.0–34.0)
MCHC: 32 g/dL (ref 30.0–36.0)
MCV: 79.7 fL — ABNORMAL LOW (ref 80.0–100.0)
Monocytes Absolute: 0.5 10*3/uL (ref 0.1–1.0)
Monocytes Relative: 10 %
Neutro Abs: 3.5 10*3/uL (ref 1.7–7.7)
Neutrophils Relative %: 63 %
Platelet Count: 190 10*3/uL (ref 150–400)
RBC: 5.26 MIL/uL — ABNORMAL HIGH (ref 3.87–5.11)
RDW: 25.6 % — ABNORMAL HIGH (ref 11.5–15.5)
WBC Count: 5.6 10*3/uL (ref 4.0–10.5)
nRBC: 0 % (ref 0.0–0.2)

## 2021-07-29 LAB — FERRITIN: Ferritin: 20 ng/mL (ref 11–307)

## 2021-07-29 LAB — IRON AND IRON BINDING CAPACITY (CC-WL,HP ONLY)
Iron: 45 ug/dL (ref 28–170)
Saturation Ratios: 11 % (ref 10.4–31.8)
TIBC: 393 ug/dL (ref 250–450)
UIBC: 348 ug/dL (ref 148–442)

## 2021-08-24 ENCOUNTER — Ambulatory Visit: Payer: 59 | Admitting: Gastroenterology

## 2021-10-14 ENCOUNTER — Inpatient Hospital Stay: Payer: 59

## 2021-11-13 NOTE — Progress Notes (Deleted)
[place]   Telephone:(336) [phone] Fax:(336) [phone]   Clinic Follow up Note   Patient Care Team: Carlyon Shadow, MD as PCP - General (Obstetrics and Gynecology) 11/13/2021  CHIEF COMPLAINT: Follow up Iron deficiency anemia   CURRENT THERAPY: IV iron PRN  INTERVAL HISTORY: Ms. Ipock returns for follow up as scheduled. Last seen by [place] as a new patient 06/02/21 for IDA with hgb of 7.6 and ferritin of 3. She received IV Venofer 300 mg x3 in 06/2021. Endoscopies by Dr. Rush Landmark 06/22/21 showed: Colonoscopy with nonbleeding hemorrhoids: EGD showed gastritis. Labs 1 month after IV iron  07/29/21 showed hgb 13.4 and ferritin of 20.     REVIEW OF SYSTEMS:   Constitutional: Denies fevers, chills or abnormal weight loss Eyes: Denies blurriness of vision Ears, nose, mouth, throat, and face: Denies mucositis or sore throat Respiratory: Denies cough, dyspnea or wheezes Cardiovascular: Denies palpitation, chest discomfort or lower extremity swelling Gastrointestinal:  Denies nausea, heartburn or change in bowel habits Skin: Denies abnormal skin rashes Lymphatics: Denies new lymphadenopathy or easy bruising Neurological:Denies numbness, tingling or new weaknesses Behavioral/Psych: Mood is stable, no new changes  All other systems were reviewed with the patient and are negative.  MEDICAL HISTORY:  Past Medical History:  Diagnosis Date   Anemia    Hiatal hernia    Uterine fibroid     SURGICAL HISTORY: Past Surgical History:  Procedure Laterality Date   breast argumentation      I have reviewed the social history and family history with the patient and they are unchanged from previous note.  ALLERGIES:  has No Known Allergies.  MEDICATIONS:  Current Outpatient Medications  Medication Sig Dispense Refill   ferrous sulfate 325 (65 FE) MG tablet Take 1 tablet (325 mg total) by mouth daily. (Patient not taking: Reported on 06/16/2021) 100 tablet 0   hydrocortisone  (ANUSOL-HC) 25 MG suppository Place 1 suppository (25 mg total) rectally at bedtime. Use nightly for 3 nights and PRN 12 suppository 1   iron dextran complex 1 mg in sodium chloride 0.9 % 500 mL Inject 1 mg into the vein every 14 (fourteen) days.     No current facility-administered medications for this visit.    PHYSICAL EXAMINATION: ECOG PERFORMANCE STATUS: {CHL ONC ECOG [phone]}  There were no vitals filed for this visit. There were no vitals filed for this visit.  GENERAL:alert, no distress and comfortable SKIN: skin color, texture, turgor are normal, no rashes or significant lesions EYES: normal, Conjunctiva are pink and non-injected, sclera clear OROPHARYNX:no exudate, no erythema and lips, buccal mucosa, and tongue normal  NECK: supple, thyroid normal size, non-tender, without nodularity LYMPH:  no palpable lymphadenopathy in the cervical, axillary or inguinal LUNGS: clear to auscultation and percussion with normal breathing effort HEART: regular rate & rhythm and no murmurs and no lower extremity edema ABDOMEN:abdomen soft, non-tender and normal bowel sounds Musculoskeletal:no cyanosis of digits and no clubbing  NEURO: alert & oriented x 3 with fluent speech, no focal motor/sensory deficits  LABORATORY DATA:  I have reviewed the data as listed    Latest Ref Rng & Units 07/29/2021    8:28 AM 06/02/2021   12:00 PM 06/02/2021   11:59 AM  CBC  WBC 4.0 - 10.5 K/uL 5.6   4.5   Hemoglobin 12.0 - 15.0 g/dL 13.4   7.6   Hematocrit 36.0 - 46.0 % 41.9  28.4  27.8   Platelets 150 - 400 K/uL 190   272  Latest Ref Rng & Units 05/26/2021    4:56 PM  CMP  Glucose 70 - 99 mg/dL 112   BUN 6 - 20 mg/dL 12   Creatinine 0.44 - 1.00 mg/dL 0.85   Sodium 135 - 145 mmol/L 136   Potassium 3.5 - 5.1 mmol/L 4.2   Chloride 98 - 111 mmol/L 106   CO2 22 - 32 mmol/L 22   Calcium 8.9 - 10.3 mg/dL 9.5   Total Protein 6.5 - 8.1 g/dL 7.4   Total Bilirubin 0.3 - 1.2 mg/dL 0.4    Alkaline Phos 38 - 126 U/L 35   AST 15 - 41 U/L 14   ALT 0 - 44 U/L 13       RADIOGRAPHIC STUDIES: I have personally reviewed the radiological images as listed and agreed with the findings in the report. No results found.   ASSESSMENT & PLAN:  No problem-specific Assessment & Plan notes found for this encounter.   No orders of the defined types were placed in this encounter.  All questions were answered. The patient knows to call the clinic with any problems, questions or concerns. No barriers to learning was detected. I spent {CHL ONC TIME VISIT - QASTM:[id number]} counseling the patient face to face. The total time spent in the appointment was {CHL ONC TIME VISIT - XQJJH:[id number]} and more than 50% was on counseling and review of test results     Alla Feeling, NP 11/13/21

## 2021-11-14 ENCOUNTER — Inpatient Hospital Stay: Payer: 59 | Admitting: Nurse Practitioner

## 2021-11-14 ENCOUNTER — Inpatient Hospital Stay: Payer: 59

## 2021-11-14 ENCOUNTER — Telehealth: Payer: Self-pay | Admitting: Nurse Practitioner

## 2021-11-14 NOTE — Telephone Encounter (Signed)
Spoke with patient in regards to in basket 11/6. Patient had previously requested appointments cancelled. Cancelled per patient request to not follow through with surviorship.

## 2023-06-12 IMAGING — MG MM BREAST BX W/ LOC DEV 1ST LESION IMAGE BX SPEC STEREO GUIDE*R*
8 of 22 series · 8 of 40 positions shown · non-contrast
Comparison: None Available.
COMPARISON: None Available.

Addendum:
CLINICAL DATA: Indeterminate right breast calcifications.

EXAM:
RIGHT BREAST STEREOTACTIC CORE NEEDLE BIOPSY

[R (1 of 8)]
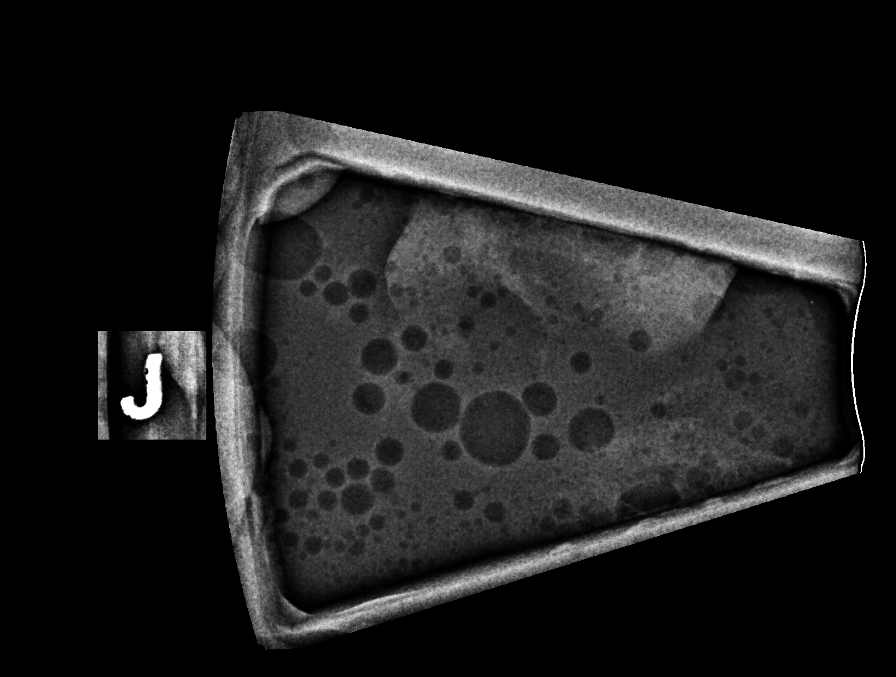

[R (2 of 8)]
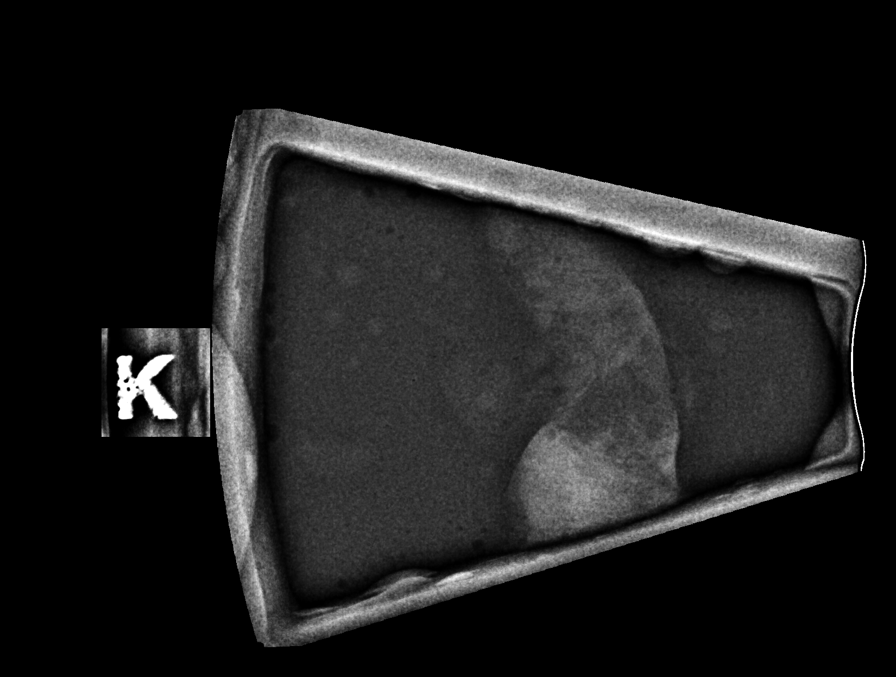

[R (3 of 8)]
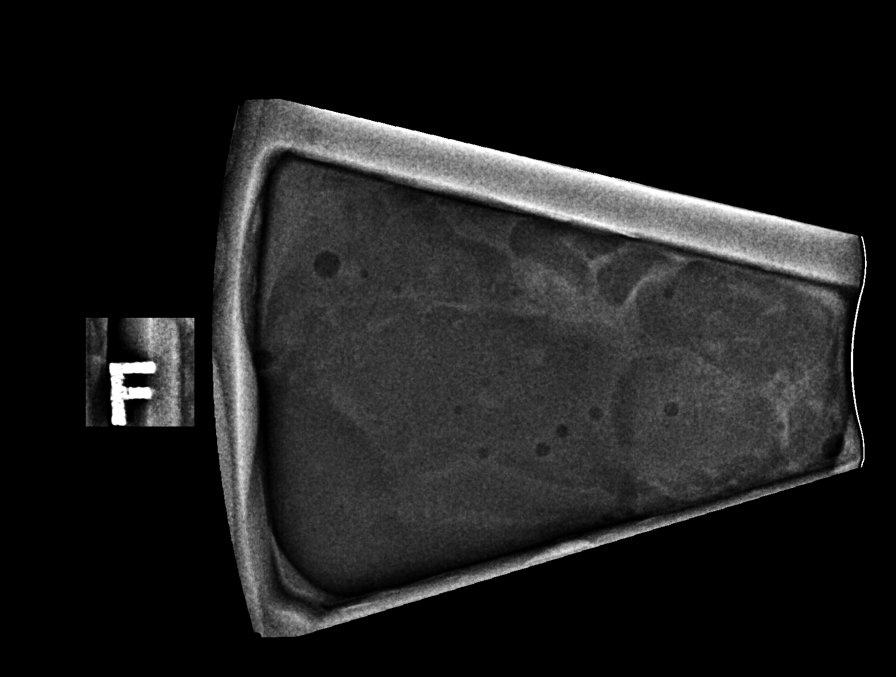

[R (4 of 8)]
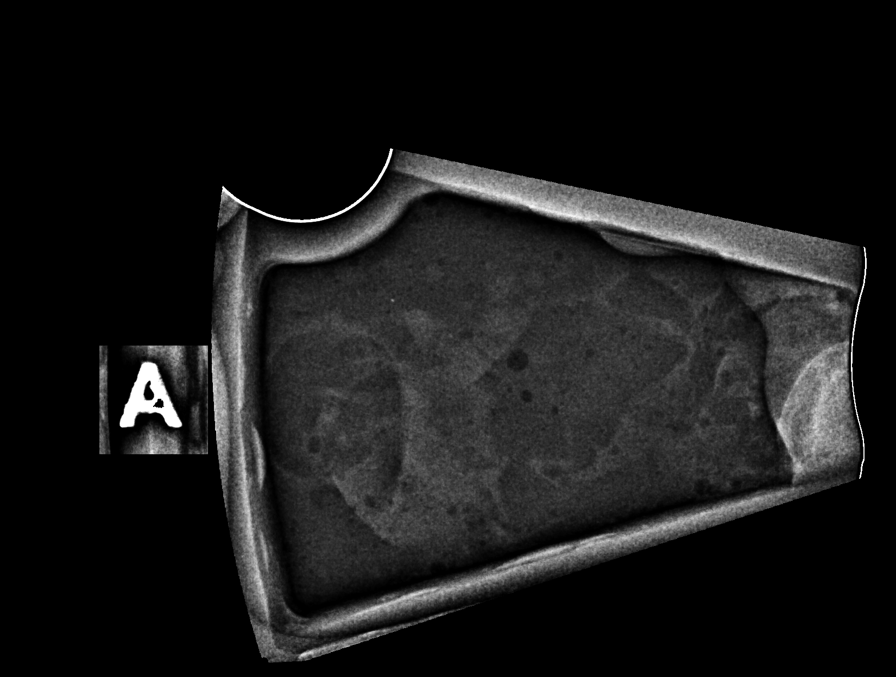

[R (5 of 8)]
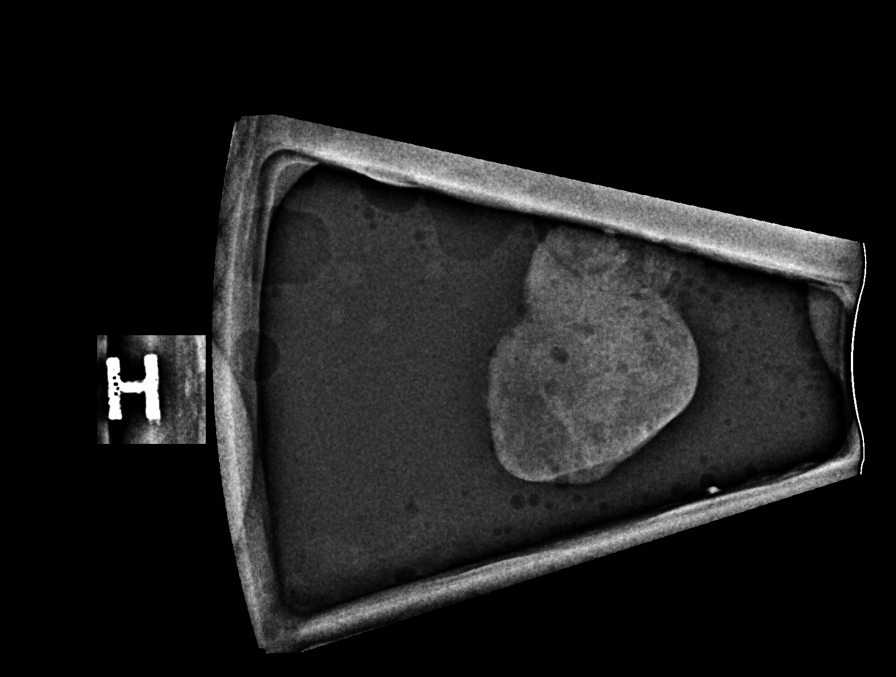

[R (6 of 8)]
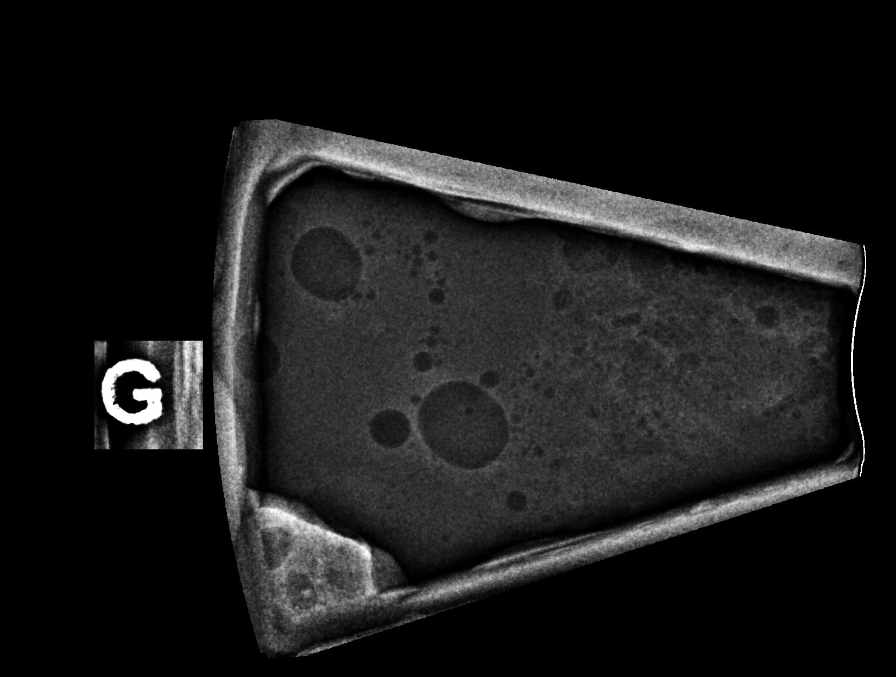

[R (7 of 8)]
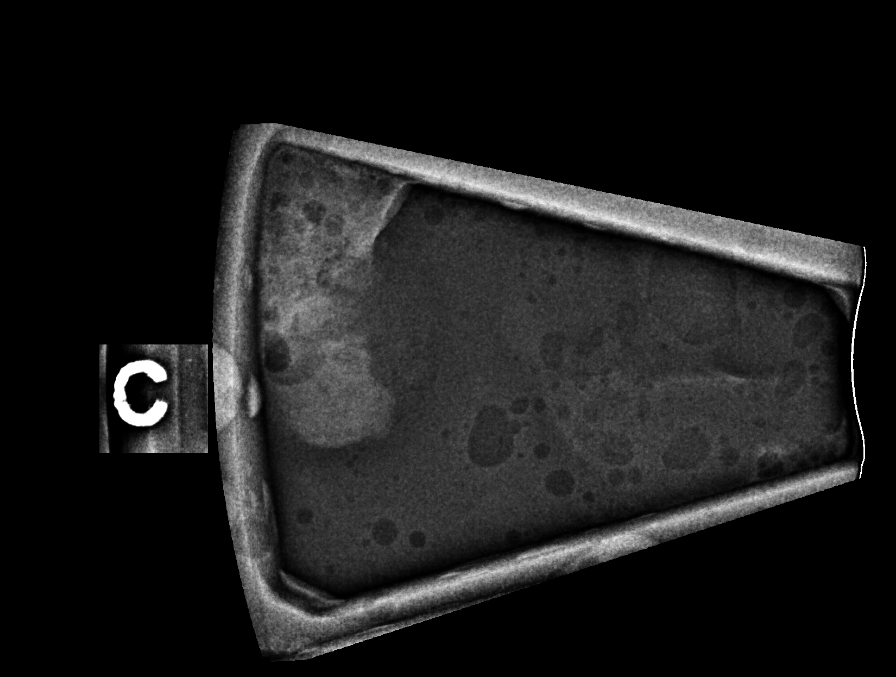

[R (8 of 8)]
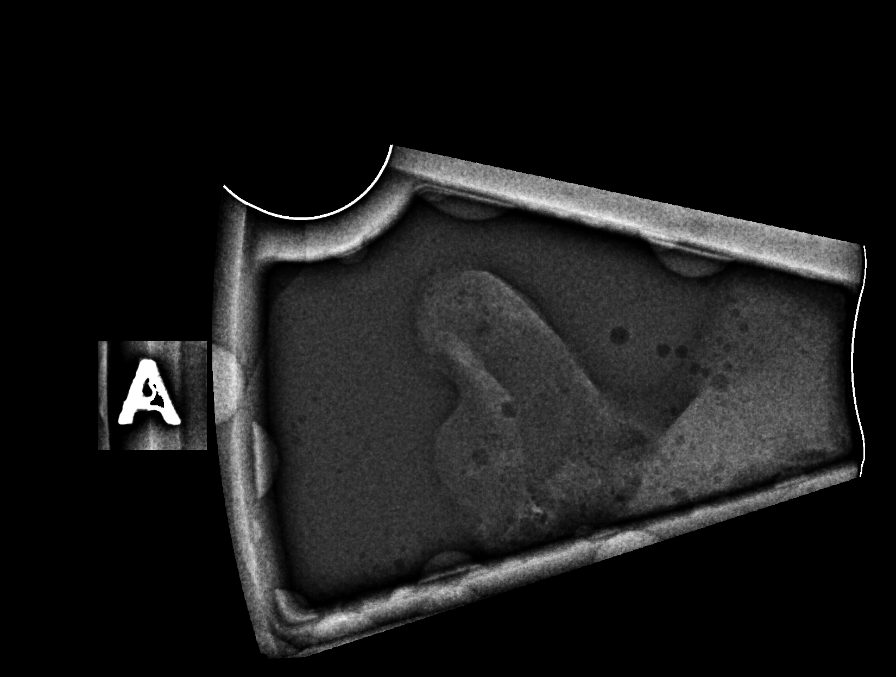

[8 of 40 positions shown; findings below may reference images not displayed]



Using sterile technique and 1% Lidocaine as local anesthetic, under
stereotactic guidance, a 9 gauge vacuum assisted device was used to
perform core needle biopsy of calcifications upper-outer right
breast using a lateral approach. Specimen radiograph was performed
showing calcifications. Specimens with calcifications are identified
for pathology.

Lesion quadrant: Upper outer quadrant

At the conclusion of the procedure, coil shaped tissue marker clip
was deployed into the biopsy cavity. Follow-up 2-view mammogram was
performed and dictated separately.
IMPRESSION: Stereotactic-guided biopsy of right breast calcifications. No
apparent complications.

ADDENDUM:
Pathology revealed FIBROCYSTIC CHANGES WITH CALCIFICATIONS of the
RIGHT breast, upper outer, (coil clip). This was found to be
concordant by Dr. Kaylaa Fong.

Pathology results were discussed with the patient by telephone. The
patient reported doing well after the biopsy with tenderness at the
site. Post biopsy instructions and care were reviewed and questions
were answered. The patient was encouraged to call The [REDACTED]

The patient was instructed to return for annual screening

Pathology results reported by Shemuketa Rosalinde, RN on 06/10/2021.



Using sterile technique and 1% Lidocaine as local anesthetic, under
stereotactic guidance, a 9 gauge vacuum assisted device was used to
perform core needle biopsy of calcifications upper-outer right
breast using a lateral approach. Specimen radiograph was performed
showing calcifications. Specimens with calcifications are identified
for pathology.

Lesion quadrant: Upper outer quadrant

At the conclusion of the procedure, coil shaped tissue marker clip
was deployed into the biopsy cavity. Follow-up 2-view mammogram was
performed and dictated separately.
IMPRESSION: Stereotactic-guided biopsy of right breast calcifications. No
apparent complications.

## 2023-09-27 ENCOUNTER — Telehealth: Payer: Self-pay | Admitting: Nurse Practitioner

## 2023-09-27 NOTE — Telephone Encounter (Signed)
Pt called to schedule appts

## 2023-10-01 ENCOUNTER — Other Ambulatory Visit: Payer: Self-pay

## 2023-10-01 ENCOUNTER — Other Ambulatory Visit: Payer: Self-pay | Admitting: Nurse Practitioner

## 2023-10-01 DIAGNOSIS — D500 Iron deficiency anemia secondary to blood loss (chronic): Secondary | ICD-10-CM

## 2023-10-01 NOTE — Progress Notes (Unsigned)
 [facility]   Telephone:(336) [phone] Fax:(336) [phone]    Patient Care Team: Lequita Evalene LABOR, MD as PCP - General (Obstetrics and Gynecology)   CHIEF COMPLAINT: Follow up anemia   CURRENT THERAPY: IV iron  PRN  INTERVAL HISTORY Ms. Acocella returns for follow up, last seen by me 06/02/21 and received IV Venofer  300 mg x3 in 06/2021. IDA resolved quickly after with labs 07/29/21 and then lost follow up.   ROS   Past Medical History:  Diagnosis Date   Anemia    Hiatal hernia    Uterine fibroid      Past Surgical History:  Procedure Laterality Date   breast argumentation       Outpatient Encounter Medications as of 10/02/2023  Medication Sig   ferrous sulfate  325 (65 FE) MG tablet Take 1 tablet (325 mg total) by mouth daily. (Patient not taking: Reported on 06/16/2021)   hydrocortisone  (ANUSOL -HC) 25 MG suppository Place 1 suppository (25 mg total) rectally at bedtime. Use nightly for 3 nights and PRN   iron  dextran complex 1 mg in sodium chloride  0.9 % 500 mL Inject 1 mg into the vein every 14 (fourteen) days.   No facility-administered encounter medications on file as of 10/02/2023.     There were no vitals filed for this visit. There is no height or weight on file to calculate BMI.   ECOG PERFORMANCE STATUS: {CHL ONC ECOG [phone]}  PHYSICAL EXAM GENERAL:alert, no distress and comfortable SKIN: no rash  EYES: sclera clear NECK: without mass LYMPH:  no palpable cervical or supraclavicular lymphadenopathy  LUNGS: clear with normal breathing effort HEART: regular rate & rhythm, no lower extremity edema ABDOMEN: abdomen soft, non-tender and normal bowel sounds NEURO: alert & oriented x 3 with fluent speech, no focal motor/sensory deficits Breast exam:  PAC without erythema    CBC    Latest Ref Rng & Units 07/29/2021    8:28 AM 06/02/2021   12:00 PM 06/02/2021   11:59 AM  CBC  WBC 4.0 - 10.5 K/uL 5.6   4.5   Hemoglobin 12.0 - 15.0 g/dL 86.5    7.6   Hematocrit 36.0 - 46.0 % 41.9  28.4  27.8   Platelets 150 - 400 K/uL 190   272       CMP     Latest Ref Rng & Units 05/26/2021    4:56 PM  CMP  Glucose 70 - 99 mg/dL 887   BUN 6 - 20 mg/dL 12   Creatinine 9.55 - 1.00 mg/dL 9.14   Sodium 864 - 854 mmol/L 136   Potassium 3.5 - 5.1 mmol/L 4.2   Chloride 98 - 111 mmol/L 106   CO2 22 - 32 mmol/L 22   Calcium 8.9 - 10.3 mg/dL 9.5   Total Protein 6.5 - 8.1 g/dL 7.4   Total Bilirubin 0.3 - 1.2 mg/dL 0.4   Alkaline Phos 38 - 126 U/L 35   AST 15 - 41 U/L 14   ALT 0 - 44 U/L 13       ASSESSMENT & PLAN: 48 year old female   Anemia, likely iron  deficiency secondary to menorrhagia -We reviewed her medical record in detail with the patient.  -She had a normal CBC in 2018 and 2019.  Found to have acute moderate anemia Hgb 7.2 at PCP 05/25/2021 and confirmed in the ED.  FOBT negative.   -EGD/colonoscopy 06/2021 showed small hernia, mild erythema in the stomach, and non bleeding hemorroids -IDA  felt to be secondary to menorrhagia  -She began oral iron  ~05/26/2021 taking 1 ferrous sulfate  and to body builder iron  tabs daily -We reviewed the other features of her CBC including low HCT 26.2, low MCV 59.4, and elevated RDW 20.8 which are consistent with iron  deficiency.  She also has pica -Today's labs show hemoglobin 7.6, HCT 27.8, MCV 61.8, RDW 23.7 with normal platelet count and WBC.  Serum iron  elevated 176, TIBC elevated 536, and 33% saturation ratio, ferritin level is pending.   -S/p IV Iron  300 mg c3 in 06/2021, IDA resolved by 07/2021   Fatigue, positional lightheadedness, exertional dyspnea, palpitations -Secondary to #1 -Hgb 7.2 on 05/25/2021   Menorrhagia and dysmenorrhea -Her periods became heavier and painful after her second child in 2006, saturates hygiene products every hour for 2 days and passes clots.  -She was sent to ED 05/26/2021 for anemia, CT AP showed 8 cm uterine fibroid -She tried OCP in the past but did not  tolerate, currently in discussion with OB/GYN for partial hysterectomy, likely will do the summer -We discussed the source of her IDA is likely menorrhagia, after hysterectomy this should resolve.  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     All questions were answered. The patient knows to call the clinic with any problems, questions or concerns. No barriers to learning were detected. I spent *** counseling the patient face to face. The total time spent in the appointment was *** and more than 50% was on counseling, review of test results, and coordination of care.   Arline Ketter K Sache Sane, NP 10/01/2023 2:40 PM

## 2023-10-02 ENCOUNTER — Encounter: Payer: Self-pay | Admitting: Nurse Practitioner

## 2023-10-02 ENCOUNTER — Ambulatory Visit: Payer: Self-pay | Admitting: Nurse Practitioner

## 2023-10-02 ENCOUNTER — Inpatient Hospital Stay (HOSPITAL_BASED_OUTPATIENT_CLINIC_OR_DEPARTMENT_OTHER): Admitting: Nurse Practitioner

## 2023-10-02 ENCOUNTER — Inpatient Hospital Stay: Attending: Nurse Practitioner

## 2023-10-02 ENCOUNTER — Telehealth: Payer: Self-pay

## 2023-10-02 VITALS — BP 130/91 | HR 86 | Temp 97.9°F | Resp 15 | Wt 159.7 lb

## 2023-10-02 DIAGNOSIS — N946 Dysmenorrhea, unspecified: Secondary | ICD-10-CM | POA: Diagnosis not present

## 2023-10-02 DIAGNOSIS — N920 Excessive and frequent menstruation with regular cycle: Secondary | ICD-10-CM | POA: Insufficient documentation

## 2023-10-02 DIAGNOSIS — R5383 Other fatigue: Secondary | ICD-10-CM | POA: Insufficient documentation

## 2023-10-02 DIAGNOSIS — D500 Iron deficiency anemia secondary to blood loss (chronic): Secondary | ICD-10-CM

## 2023-10-02 DIAGNOSIS — Z87891 Personal history of nicotine dependence: Secondary | ICD-10-CM | POA: Diagnosis not present

## 2023-10-02 DIAGNOSIS — R0609 Other forms of dyspnea: Secondary | ICD-10-CM | POA: Diagnosis not present

## 2023-10-02 DIAGNOSIS — R002 Palpitations: Secondary | ICD-10-CM | POA: Diagnosis not present

## 2023-10-02 LAB — CBC WITH DIFFERENTIAL (CANCER CENTER ONLY)
Abs Immature Granulocytes: 0.01 K/uL (ref 0.00–0.07)
Basophils Absolute: 0.1 K/uL (ref 0.0–0.1)
Basophils Relative: 1 %
Eosinophils Absolute: 0.2 K/uL (ref 0.0–0.5)
Eosinophils Relative: 3 %
HCT: 40 % (ref 36.0–46.0)
Hemoglobin: 13.1 g/dL (ref 12.0–15.0)
Immature Granulocytes: 0 %
Lymphocytes Relative: 29 %
Lymphs Abs: 1.8 K/uL (ref 0.7–4.0)
MCH: 27.1 pg (ref 26.0–34.0)
MCHC: 32.8 g/dL (ref 30.0–36.0)
MCV: 82.6 fL (ref 80.0–100.0)
Monocytes Absolute: 0.6 K/uL (ref 0.1–1.0)
Monocytes Relative: 9 %
Neutro Abs: 3.6 K/uL (ref 1.7–7.7)
Neutrophils Relative %: 58 %
Platelet Count: 205 K/uL (ref 150–400)
RBC: 4.84 MIL/uL (ref 3.87–5.11)
RDW: 14.6 % (ref 11.5–15.5)
WBC Count: 6.2 K/uL (ref 4.0–10.5)
nRBC: 0 % (ref 0.0–0.2)

## 2023-10-02 LAB — CMP (CANCER CENTER ONLY)
ALT: 9 U/L (ref 0–44)
AST: 13 U/L — ABNORMAL LOW (ref 15–41)
Albumin: 4.1 g/dL (ref 3.5–5.0)
Alkaline Phosphatase: 38 U/L (ref 38–126)
Anion gap: 5 (ref 5–15)
BUN: 10 mg/dL (ref 6–20)
CO2: 27 mmol/L (ref 22–32)
Calcium: 9.1 mg/dL (ref 8.9–10.3)
Chloride: 104 mmol/L (ref 98–111)
Creatinine: 0.89 mg/dL (ref 0.44–1.00)
GFR, Estimated: >60 mL/min (ref >60)
Glucose, Bld: 99 mg/dL (ref 70–99)
Potassium: 3.9 mmol/L (ref 3.5–5.1)
Sodium: 136 mmol/L (ref 135–145)
Total Bilirubin: 0.4 mg/dL (ref 0.0–1.2)
Total Protein: 6.8 g/dL (ref 6.5–8.1)

## 2023-10-02 LAB — IRON AND IRON BINDING CAPACITY (CC-WL,HP ONLY)
Iron: 383 ug/dL — ABNORMAL HIGH (ref 28–170)
Saturation Ratios: 89 % — ABNORMAL HIGH (ref 10.4–31.8)
TIBC: 433 ug/dL (ref 250–450)
UIBC: 50 ug/dL — ABNORMAL LOW (ref 148–442)

## 2023-10-02 LAB — FERRITIN: Ferritin: 27 ng/mL (ref 11–307)

## 2023-10-02 NOTE — Telephone Encounter (Addendum)
 Janice Lawrence, patient will be scheduled as soon as possible.  Auth Submission: NO AUTH NEEDED Site of care: Site of care: [facility] Payer: [employer] commercial Medication & CPT/J Code(s) submitted: Venofer  (Iron  Sucrose) J1756 Diagnosis Code:  Route of submission (phone, fax, portal):  Phone # Fax # Auth type: Buy/Bill PB Units/visits requested: 300mg  x 1 dose Reference number:  Approval from: 10/02/23 to 01/09/24

## 2023-10-03 NOTE — Telephone Encounter (Signed)
 Contacted patient at Janice Lawrence's request with following message: Ferritin is 27, low normal. I'd recommend just 1 dose venofer  300 mg and patient take oral iron  daily consistently, then get labs at PCP and additional IV iron  if needed in the future.  Please contact office for questions or concerns.  LVM on named voice mail with information in message.

## 2023-11-06 ENCOUNTER — Ambulatory Visit (INDEPENDENT_AMBULATORY_CARE_PROVIDER_SITE_OTHER): Admitting: *Deleted

## 2023-11-06 VITALS — BP 110/72 | HR 62 | Temp 97.9°F | Resp 16 | Ht 72.0 in | Wt 160.6 lb

## 2023-11-06 DIAGNOSIS — D500 Iron deficiency anemia secondary to blood loss (chronic): Secondary | ICD-10-CM

## 2023-11-06 MED ORDER — SODIUM CHLORIDE 0.9 % IV SOLN
300.0000 mg | Freq: Once | INTRAVENOUS | Status: AC
  Administered 2023-11-06: 300 mg via INTRAVENOUS
  Filled 2023-11-06: qty 15

## 2023-11-06 MED ORDER — LORATADINE 10 MG PO TABS: 10.0000 mg | ORAL_TABLET | Freq: Every day | ORAL | Status: DC

## 2023-11-06 NOTE — Progress Notes (Signed)
 Diagnosis: Iron  Deficiency Anemia  Provider:  Mannam, Praveen MD  Procedure: IV Infusion  IV Type: Peripheral, IV Location: R Forearm  Venofer  (Iron  Sucrose), Dose: 300 mg  Infusion Start Time: 1344 pm  Infusion Stop Time: 1543  Post Infusion IV Care: Observation period completed and Peripheral IV Discontinued  Discharge: Condition: Good, Destination: Home . AVS Declined  Performed by:  Trudy Lamarr LABOR, RN
